# Patient Record
Sex: Male | Born: 1975
Health system: Southern US, Community
[De-identification: ages and names within clinical notes are randomized; demographics above are authoritative.]

## PROBLEM LIST (undated history)

## (undated) DIAGNOSIS — K5190 Ulcerative colitis, unspecified, without complications: Secondary | ICD-10-CM

## (undated) DIAGNOSIS — E785 Hyperlipidemia, unspecified: Secondary | ICD-10-CM

## (undated) DIAGNOSIS — I10 Essential (primary) hypertension: Secondary | ICD-10-CM

## (undated) DIAGNOSIS — Z8042 Family history of malignant neoplasm of prostate: Secondary | ICD-10-CM

## (undated) DIAGNOSIS — Z803 Family history of malignant neoplasm of breast: Secondary | ICD-10-CM

## (undated) DIAGNOSIS — C189 Malignant neoplasm of colon, unspecified: Secondary | ICD-10-CM

## (undated) HISTORY — PX: WISDOM TOOTH EXTRACTION: SHX21

## (undated) HISTORY — DX: Family history of malignant neoplasm of breast: Z80.3

## (undated) HISTORY — PX: COLONOSCOPY: SHX174

## (undated) HISTORY — DX: Hyperlipidemia, unspecified: E78.5

## (undated) HISTORY — DX: Essential (primary) hypertension: I10

## (undated) HISTORY — DX: Family history of malignant neoplasm of prostate: Z80.42

---

## 2018-01-26 DIAGNOSIS — Z23 Encounter for immunization: Secondary | ICD-10-CM | POA: Diagnosis not present

## 2019-06-09 ENCOUNTER — Ambulatory Visit: Payer: Self-pay

## 2019-06-09 ENCOUNTER — Ambulatory Visit: Payer: Self-pay | Attending: Internal Medicine

## 2019-06-09 DIAGNOSIS — Z23 Encounter for immunization: Secondary | ICD-10-CM | POA: Insufficient documentation

## 2019-06-09 NOTE — Progress Notes (Signed)
   Covid-19 Vaccination Clinic  Name:  Don Meyer    MRN: [id number] DOB: 01/15/1976  06/09/2019  [name] was observed post Covid-19 immunization for 15 minutes without incident. He was provided with Vaccine Information Sheet and instruction to access the V-Safe system.   [name] was instructed to call 911 with any severe reactions post vaccine: Don Meyer Kitchen Difficulty breathing  . Swelling of face and throat  . A fast heartbeat  . A bad rash all over body  . Dizziness and weakness   Immunizations Administered    Name Date Dose VIS Date Route   Pfizer COVID-19 Vaccine 06/09/2019 11:45 AM 0.3 mL 03/15/2019 Intramuscular   Manufacturer: [place]   Lot: [id number]   [place]: [id number]

## 2019-07-09 ENCOUNTER — Ambulatory Visit: Payer: Self-pay

## 2019-07-10 ENCOUNTER — Ambulatory Visit: Payer: Self-pay | Attending: Internal Medicine

## 2019-07-10 DIAGNOSIS — Z23 Encounter for immunization: Secondary | ICD-10-CM

## 2019-07-10 NOTE — Progress Notes (Signed)
   Covid-19 Vaccination Clinic  Name:  Don Meyer    MRN: [id number] DOB: 11/17/1975  07/10/2019  [name] was observed post Covid-19 immunization for 15 minutes without incident. He was provided with Vaccine Information Sheet and instruction to access the V-Safe system.   [name] was instructed to call 911 with any severe reactions post vaccine: Marland Kitchen Difficulty breathing  . Swelling of face and throat  . A fast heartbeat  . A bad rash all over body  . Dizziness and weakness   Immunizations Administered    Name Date Dose VIS Date Route   Pfizer COVID-19 Vaccine 07/10/2019  1:04 PM 0.3 mL 03/15/2019 Intramuscular   Manufacturer: [place]   Lot: [id number]   [place]: [id number]

## 2019-12-19 DIAGNOSIS — Z23 Encounter for immunization: Secondary | ICD-10-CM | POA: Diagnosis not present

## 2019-12-19 DIAGNOSIS — K51919 Ulcerative colitis, unspecified with unspecified complications: Secondary | ICD-10-CM | POA: Diagnosis not present

## 2019-12-19 DIAGNOSIS — R9431 Abnormal electrocardiogram [ECG] [EKG]: Secondary | ICD-10-CM | POA: Diagnosis not present

## 2019-12-19 DIAGNOSIS — R030 Elevated blood-pressure reading, without diagnosis of hypertension: Secondary | ICD-10-CM | POA: Diagnosis not present

## 2019-12-19 DIAGNOSIS — Z0000 Encounter for general adult medical examination without abnormal findings: Secondary | ICD-10-CM | POA: Diagnosis not present

## 2019-12-23 DIAGNOSIS — E7800 Pure hypercholesterolemia, unspecified: Secondary | ICD-10-CM | POA: Diagnosis not present

## 2019-12-23 DIAGNOSIS — Z0000 Encounter for general adult medical examination without abnormal findings: Secondary | ICD-10-CM | POA: Diagnosis not present

## 2020-01-01 DIAGNOSIS — K5190 Ulcerative colitis, unspecified, without complications: Secondary | ICD-10-CM | POA: Diagnosis not present

## 2020-01-01 DIAGNOSIS — K625 Hemorrhage of anus and rectum: Secondary | ICD-10-CM | POA: Diagnosis not present

## 2020-01-03 NOTE — Progress Notes (Signed)
Patient referred by Deland Pretty, MD for abnormal EKG  Subjective:   Don Meyer, male    DOB: [birth year], 44 y.o.   MRN: [id number]   Chief Complaint  Patient presents with  . Abnormal ECG  . New Patient (Initial Visit)  . Hypertension     HPI  44 y.o. 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 He has had untreated hypertension and hyperlipidemia for quite some time. He has now decided to take care of his health issues.  He has establish care with a PCP and was recently started on losartan 25 mg and 10 mg daily.  Is started walking treadmill for 2 miles 4 days a week.  He admits to eating fast food in the past, but is trying to make positive changes to his diet as well.  He denies any chest pain shortness of breath, orthopnea, PND symptoms.  Past Medical History:  Diagnosis Date  . Hyperlipidemia   . Hypertension      History reviewed. No pertinent surgical history.   Social History   Tobacco Use  Smoking Status Never Smoker  Smokeless Tobacco Never Used    Social History   Substance and Sexual Activity  Alcohol Use Yes   Comment: occ     Family History  Problem Relation Age of Onset  . Heart disease Mother   . Drug abuse Brother      Current Outpatient Medications on File Prior to Visit  Medication Sig Dispense Refill  . losartan (COZAAR) 25 MG tablet Take 25 mg by mouth daily.    . Misc Natural Products (CHOLESTEROL SUPPORT PO) Take 1 tablet by mouth daily.    . Multiple Vitamin (ONE-A-DAY MENS PO) Take 1 tablet by mouth daily.    . Omega-3 Fatty Acids (FISH OIL) 600 MG CAPS Take 1 tablet by mouth daily.    . rosuvastatin (CRESTOR) 10 MG tablet Take 10 mg by mouth daily.     No current facility-administered medications on file prior to visit.    Cardiovascular and other pertinent studies:  EKG 01/06/2020: Sinus rhythm 93 bpm Nonspecific T  wave chages  EKG 12/19/2019: Sinus rhythm 66 bpm. Inferolateral T wave inversion, consider ischemia   Recent labs: 12/23/2019: Glucose 84, BUN/Cr 11/?. EGFR 97. Na/K 145/4.8. ALT: 84 H/H 15.7/46.6. MCV 88.9. Platelets 249 Chol 244, TG 183, HDL 26, LDL 181   Review of Systems  Cardiovascular: Negative for chest pain, dyspnea on exertion, leg swelling, palpitations and syncope.         Vitals:   01/06/20 1514 01/06/20 1516  BP: (!) 155/109 (!) 153/104  Pulse: (!) 102 (!) 102  Resp: 16   SpO2: 100%      Body mass index is 34.97 kg/m. Filed Weights   01/06/20 1514  Weight: 230 lb (104.3 kg)     Objective:   Physical Exam Vitals and nursing note reviewed.  Constitutional:      General: He is not in acute distress. Neck:     Vascular: No JVD.  Cardiovascular:     Rate and Rhythm: Normal rate and regular rhythm.     Heart sounds: Normal heart sounds. No murmur heard.   Pulmonary:     Effort: Pulmonary effort is normal.     Breath sounds: Normal breath sounds. No wheezing or rales.         Assessment & Recommendations:   44  y.o. Caucasian male with hypertension, hyperlipidemia, abnormal EKG  Hypertension: Increase losartan to 50 mg daily.  Discussed low-salt diet. Check BMP in 1 week.  Abnormal EKG: Likely hypertensive changes.  Will obtain echocardiogram.  Hyperlipidemia: Increase rosuvastatin 20 mg daily.  Discussed heart healthy diet, including Mediterranean diet. We discussed performing calcium score scan.  Patient will follow up at this time. Repeat lipid panel in 3 months.  Follow-up in 3 months.   Thank you for referring the patient to [place]. Please feel free to contact with any questions.   [name], MD Pager: [phone] Office: [phone]

## 2020-01-06 ENCOUNTER — Ambulatory Visit: Payer: 59 | Admitting: Cardiology

## 2020-01-06 ENCOUNTER — Encounter: Payer: Self-pay | Admitting: Cardiology

## 2020-01-06 ENCOUNTER — Other Ambulatory Visit: Payer: Self-pay

## 2020-01-06 VITALS — BP 153/104 | HR 102 | Resp 16 | Ht 68.0 in | Wt 230.0 lb

## 2020-01-06 DIAGNOSIS — E782 Mixed hyperlipidemia: Secondary | ICD-10-CM

## 2020-01-06 DIAGNOSIS — R9431 Abnormal electrocardiogram [ECG] [EKG]: Secondary | ICD-10-CM

## 2020-01-06 DIAGNOSIS — I10 Essential (primary) hypertension: Secondary | ICD-10-CM

## 2020-01-06 MED ORDER — ROSUVASTATIN CALCIUM 20 MG PO TABS: 20.0000 mg | ORAL_TABLET | Freq: Every day | ORAL | 3 refills | Status: DC

## 2020-01-06 MED ORDER — LOSARTAN POTASSIUM 50 MG PO TABS: 50.0000 mg | ORAL_TABLET | Freq: Every day | ORAL | 3 refills | Status: DC

## 2020-04-06 DIAGNOSIS — I10 Essential (primary) hypertension: Secondary | ICD-10-CM | POA: Diagnosis not present

## 2020-04-06 DIAGNOSIS — E782 Mixed hyperlipidemia: Secondary | ICD-10-CM | POA: Diagnosis not present

## 2020-04-07 LAB — BASIC METABOLIC PANEL
BUN/Creatinine Ratio: 12 (ref 9–20)
BUN: 11 mg/dL (ref 6–24)
CO2: 25 mmol/L (ref 20–29)
Calcium: 9.1 mg/dL (ref 8.7–10.2)
Chloride: 105 mmol/L (ref 96–106)
Creatinine, Ser: 0.91 mg/dL (ref 0.76–1.27)
GFR calc Af Amer: 119 mL/min/{1.73_m2} (ref >59)
GFR calc non Af Amer: 103 mL/min/{1.73_m2} (ref >59)
Glucose: 90 mg/dL (ref 65–99)
Potassium: 3.8 mmol/L (ref 3.5–5.2)
Sodium: 144 mmol/L (ref 134–144)

## 2020-04-07 LAB — LIPID PANEL
Chol/HDL Ratio: 6 ratio — ABNORMAL HIGH (ref 0.0–5.0)
Cholesterol, Total: 137 mg/dL (ref 100–199)
HDL: 23 mg/dL — ABNORMAL LOW (ref >39)
LDL Chol Calc (NIH): 73 mg/dL (ref 0–99)
Triglycerides: 249 mg/dL — ABNORMAL HIGH (ref 0–149)
VLDL Cholesterol Cal: 41 mg/dL — ABNORMAL HIGH (ref 5–40)

## 2020-04-08 ENCOUNTER — Other Ambulatory Visit: Payer: Self-pay

## 2020-04-08 ENCOUNTER — Ambulatory Visit: Payer: 59 | Admitting: Cardiology

## 2020-04-08 ENCOUNTER — Encounter: Payer: Self-pay | Admitting: Cardiology

## 2020-04-08 VITALS — BP 148/92 | HR 85 | Resp 16 | Ht 68.0 in | Wt 236.0 lb

## 2020-04-08 DIAGNOSIS — E782 Mixed hyperlipidemia: Secondary | ICD-10-CM | POA: Diagnosis not present

## 2020-04-08 DIAGNOSIS — I10 Essential (primary) hypertension: Secondary | ICD-10-CM | POA: Diagnosis not present

## 2020-04-08 MED ORDER — LOSARTAN POTASSIUM-HCTZ 50-12.5 MG PO TABS: 1.0000 | ORAL_TABLET | Freq: Every day | ORAL | 3 refills | Status: DC

## 2020-04-08 NOTE — Progress Notes (Signed)
Patient referred by Deland Pretty, MD for abnormal EKG  Subjective:   Don Meyer, male    DOB: 1976-02-17, 45 y.o.   MRN: [id number]   Chief Complaint  Patient presents with  . Hypertension  . Follow-up     HPI  45 y.o. Caucasian male with hypertension, hyperlipidemia, abnormal EKG  Reviewed recent lab results with the patient, details below.  He is trying to make changes to his diet and lifestyle.  Blood pressure has slightly improved, but remains elevated.  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 He has had untreated hypertension and hyperlipidemia for quite some time. He has now decided to take care of his health issues.  He has establish care with a PCP and was recently started on losartan 25 mg and 10 mg daily.  Is started walking treadmill for 2 miles 4 days a week.  He admits to eating fast food in the past, but is trying to make positive changes to his diet as well.  He denies any chest pain shortness of breath, orthopnea, PND symptoms.    Current Outpatient Medications on File Prior to Visit  Medication Sig Dispense Refill  . losartan (COZAAR) 50 MG tablet Take 1 tablet (50 mg total) by mouth daily. 90 tablet 3  . Misc Natural Products (CHOLESTEROL SUPPORT PO) Take 1 tablet by mouth daily.    . Multiple Vitamin (ONE-A-DAY MENS PO) Take 1 tablet by mouth daily.    . Omega-3 Fatty Acids (FISH OIL) 600 MG CAPS Take 1 tablet by mouth daily.    . rosuvastatin (CRESTOR) 20 MG tablet Take 1 tablet (20 mg total) by mouth daily. 90 tablet 3   No current facility-administered medications on file prior to visit.    Cardiovascular and other pertinent studies:  EKG 01/06/2020: Sinus rhythm 93 bpm Nonspecific T wave chages  EKG 12/19/2019: Sinus rhythm 66 bpm. Inferolateral T wave inversion, consider ischemia   Recent labs: 04/06/2020: Glucose 90, BUN/Cr 11/0.91. EGFR normal. Na/K 144/3.8. Chol 137,  TG 249, HDL 23, LDL 73  12/23/2019: Glucose 84, BUN/Cr 11/?. EGFR 97. Na/K 145/4.8. ALT: 84 H/H 15.7/46.6. MCV 88.9. Platelets 249 Chol 244, TG 183, HDL 26, LDL 181   Review of Systems  Cardiovascular: Negative for chest pain, dyspnea on exertion, leg swelling, palpitations and syncope.         Vitals:   04/08/20 1444 04/08/20 1446  BP: (!) 146/95 (!) 148/92  Pulse: 88 85  Resp: 16   SpO2: 98% 97%     Body mass index is 35.88 kg/m. Filed Weights   04/08/20 1444  Weight: 236 lb (107 kg)     Objective:   Physical Exam Vitals and nursing note reviewed.  Constitutional:      General: He is not in acute distress. Neck:     Vascular: No JVD.  Cardiovascular:     Rate and Rhythm: Normal rate and regular rhythm.     Heart sounds: Normal heart sounds. No murmur heard.   Pulmonary:     Effort: Pulmonary effort is normal.     Breath sounds: Normal breath sounds. No wheezing or rales.         Assessment & Recommendations:   45 y.o. Caucasian male with hypertension, hyperlipidemia, abnormal EKG  Hypertension: Change losartan 50 mg to losartan-hydrochlorothiazide 50-12.5 mg daily.   Patient will send a message next 2 weeks.  If blood pressure remains elevated, will  increase to 100-25 mg daily.    Hyperlipidemia: LDL 181-->73 on rosuvastatin 20 mg daily. Continue the same, Recommend heart healthy diet, including Mediterranean diet.  Follow-up in 3 months   [name], MD Pager: [phone] Office: [phone]

## 2020-05-21 ENCOUNTER — Other Ambulatory Visit: Payer: Self-pay | Admitting: Surgery

## 2020-05-21 DIAGNOSIS — K5100 Ulcerative (chronic) pancolitis without complications: Secondary | ICD-10-CM | POA: Diagnosis not present

## 2020-05-21 DIAGNOSIS — Z1211 Encounter for screening for malignant neoplasm of colon: Secondary | ICD-10-CM | POA: Diagnosis not present

## 2020-05-21 DIAGNOSIS — K635 Polyp of colon: Secondary | ICD-10-CM | POA: Diagnosis not present

## 2020-05-21 DIAGNOSIS — K529 Noninfective gastroenteritis and colitis, unspecified: Secondary | ICD-10-CM | POA: Diagnosis not present

## 2020-05-21 DIAGNOSIS — C187 Malignant neoplasm of sigmoid colon: Secondary | ICD-10-CM | POA: Diagnosis not present

## 2020-05-21 DIAGNOSIS — K5190 Ulcerative colitis, unspecified, without complications: Secondary | ICD-10-CM | POA: Diagnosis not present

## 2020-05-28 ENCOUNTER — Other Ambulatory Visit: Payer: Self-pay | Admitting: Gastroenterology

## 2020-05-28 ENCOUNTER — Other Ambulatory Visit (HOSPITAL_COMMUNITY): Payer: Self-pay | Admitting: Gastroenterology

## 2020-05-28 DIAGNOSIS — C189 Malignant neoplasm of colon, unspecified: Secondary | ICD-10-CM

## 2020-05-29 ENCOUNTER — Ambulatory Visit (HOSPITAL_BASED_OUTPATIENT_CLINIC_OR_DEPARTMENT_OTHER)
Admission: RE | Admit: 2020-05-29 | Discharge: 2020-05-29 | Disposition: A | Discharge status: Home / Self Care | Payer: 59 | Source: Ambulatory Visit | Attending: Gastroenterology | Admitting: Gastroenterology

## 2020-05-29 ENCOUNTER — Other Ambulatory Visit (HOSPITAL_COMMUNITY): Payer: Self-pay | Admitting: Gastroenterology

## 2020-05-29 ENCOUNTER — Other Ambulatory Visit: Payer: Self-pay

## 2020-05-29 DIAGNOSIS — C189 Malignant neoplasm of colon, unspecified: Secondary | ICD-10-CM | POA: Diagnosis not present

## 2020-05-29 DIAGNOSIS — N281 Cyst of kidney, acquired: Secondary | ICD-10-CM | POA: Diagnosis not present

## 2020-05-29 DIAGNOSIS — K7689 Other specified diseases of liver: Secondary | ICD-10-CM | POA: Diagnosis not present

## 2020-05-29 DIAGNOSIS — Z7689 Persons encountering health services in other specified circumstances: Secondary | ICD-10-CM | POA: Diagnosis not present

## 2020-05-29 DIAGNOSIS — K760 Fatty (change of) liver, not elsewhere classified: Secondary | ICD-10-CM | POA: Diagnosis not present

## 2020-05-29 DIAGNOSIS — R9389 Abnormal findings on diagnostic imaging of other specified body structures: Secondary | ICD-10-CM

## 2020-05-29 DIAGNOSIS — K769 Liver disease, unspecified: Secondary | ICD-10-CM

## 2020-05-29 MED ORDER — IOHEXOL 300 MG/ML  SOLN
100.0000 mL | Freq: Once | INTRAMUSCULAR | Status: AC | PRN
  Administered 2020-05-29: 100 mL via INTRAVENOUS

## 2020-06-02 DIAGNOSIS — K5190 Ulcerative colitis, unspecified, without complications: Secondary | ICD-10-CM | POA: Diagnosis not present

## 2020-06-02 DIAGNOSIS — C187 Malignant neoplasm of sigmoid colon: Secondary | ICD-10-CM | POA: Diagnosis not present

## 2020-06-08 ENCOUNTER — Other Ambulatory Visit: Payer: Self-pay

## 2020-06-08 ENCOUNTER — Ambulatory Visit (HOSPITAL_COMMUNITY)
Admission: RE | Admit: 2020-06-08 | Discharge: 2020-06-08 | Disposition: A | Discharge status: Home / Self Care | Payer: 59 | Source: Ambulatory Visit | Attending: Gastroenterology | Admitting: Gastroenterology

## 2020-06-08 DIAGNOSIS — K760 Fatty (change of) liver, not elsewhere classified: Secondary | ICD-10-CM | POA: Diagnosis not present

## 2020-06-08 DIAGNOSIS — R9389 Abnormal findings on diagnostic imaging of other specified body structures: Secondary | ICD-10-CM | POA: Insufficient documentation

## 2020-06-08 DIAGNOSIS — N281 Cyst of kidney, acquired: Secondary | ICD-10-CM | POA: Diagnosis not present

## 2020-06-08 DIAGNOSIS — K769 Liver disease, unspecified: Secondary | ICD-10-CM | POA: Diagnosis not present

## 2020-06-08 DIAGNOSIS — C189 Malignant neoplasm of colon, unspecified: Secondary | ICD-10-CM | POA: Diagnosis not present

## 2020-06-08 MED ORDER — GADOBUTROL 1 MMOL/ML IV SOLN
10.0000 mL | Freq: Once | INTRAVENOUS | Status: AC | PRN
  Administered 2020-06-08: 10 mL via INTRAVENOUS

## 2020-06-09 DIAGNOSIS — C187 Malignant neoplasm of sigmoid colon: Secondary | ICD-10-CM | POA: Diagnosis not present

## 2020-06-25 DIAGNOSIS — Z01818 Encounter for other preprocedural examination: Secondary | ICD-10-CM | POA: Diagnosis not present

## 2020-06-25 DIAGNOSIS — C187 Malignant neoplasm of sigmoid colon: Secondary | ICD-10-CM | POA: Diagnosis not present

## 2020-06-29 LAB — SURGICAL PATHOLOGY

## 2020-07-07 ENCOUNTER — Ambulatory Visit: Payer: Self-pay | Admitting: Surgery

## 2020-07-07 NOTE — H&P (Signed)
CC: Referred by Dr. Benson Meyer for sigmoidal polyp biopsied, adenocarcinoma in setting of long-standing ulcerative colitis  HPI: [name] is a very pleasant 40yoM who reports a 20+ year history of ulcer colitis. He states his first diagnosed back in his early 57s. Approximately 15 years ago he had been maintained on what he believes to be in his alanine and occasional steroids but for the last 15 years has been on no treatment. He reports he had a colonoscopy in 2007 which I do not have a copy of. He underwent a colonoscopy with Dr. Benson Meyer 05/21/20 that demonstrated moderately active pan colitis consistent with ulcerative colitis with random biopsies. A polyp was found in the sigmoid colon that appeared worrisome in its appearance and therefore was just biopsied. This returned with adenocarcinoma. The other biopsies throughout his colon were also in the same bottle but returned with chronic colitis, mildly active. No dysplasia or malignancy. He underwent staging CT 05/29/20 chest/abdomen/pelvis which showed 2 tiny indeterminate low attenuating lesions in the liver. Liver metastases cannot definitely be excluded and MRI recommended. No other worrisome findings. He is scheduled for an MRI 06/08/20.  Of note, on his colonoscopy, he does have rather diffuse pan colitis which was characterized as mild in appearance but notable throughout his entire colon. He has no history of anorectal abscesses or fistula. He has no history of recurring aphthous ulcers or anal fissures.  He reports he has 1-2 BMs per day at baseline, well formed stool without blood. Denies abdominal pain or bloating.  INTERVAL HX He returns today for follow-up - he denies any changes in his health or health history. MRI abd w/without contrast was completed 06/08/20 - showed the liver lesions noted on his CT scan to be benign subcentimeter cysts without solid mass or suspicious enhancement. No evidence of metastatic disease or lymphadenopathy.  Some hepatic steatosis. He has had some time to think about everything and go over his options with his wife. He is interested in pursuing ileal J-pouch if possible. Path has been requested from [facility] for review with our GI pathologists  PMH: Ulcerative colitis  PSH: Denies  FHx: Denies FHx of colorectal, breast, endometrial, ovarian or cervical cancer. He also denies any known family history of IBD/Crohn's/ulcerative colitis.  Social: Denies use of tobacco/EtOH/drugs. He is here today with his wife. He works for the city of [place]: A comprehensive 10 system review of systems was completed with the patient and pertinent findings as noted above.  The patient is a 45 year old male.   Allergies Don Meyer, CMA; 06/09/2020 9:05 AM) Allergies Reconciled   Medication History Don Meyer Don Meyer, CMA; 06/09/2020 9:05 AM) Losartan Potassium (25MG  Tablet, Oral) Active. Crestor (10MG  Tablet, Oral) Active. Multi-Vitamin (Oral) Active. Medications Reconciled    Review of Systems Don Gave M. Alexes Lamarque MD; 06/09/2020 9:23 AM) General Not Present- Appetite Loss, Chills, Fatigue, Fever, Night Sweats, Weight Gain and Weight Loss. HEENT Not Present- Blurred Vision, Double Vision and Headache. Respiratory Not Present- Cough and Decreased Exercise Tolerance. Gastrointestinal Not Present- Abdominal Pain, Bloating, Chronic diarrhea and Constipation. Male Genitourinary Not Present- Blood in Urine, Change in Urinary Stream, Frequency, Impotence, Nocturia, Painful Urination, Urgency and Urine Leakage. Musculoskeletal Not Present- Back Pain, Joint Pain, Joint Stiffness, Muscle Pain, Muscle Weakness and Swelling of Extremities. Neurological Not Present- Decreased Memory, Fainting, Headaches, Numbness, Seizures, Tingling, Tremor, Trouble walking and Weakness. Psychiatric Not Present- Anxiety, Bipolar, Change in Sleep Pattern, Depression, Fearful and Frequent crying. Endocrine Not Present-  Cold  Intolerance, Excessive Hunger, Hair Changes, Heat Intolerance and New Diabetes. Hematology Not Present- Abnormal Bleeding and Blood Clots.   Physical Exam Don Gave M. Levander Katzenstein MD; 06/09/2020 9:27 AM) The physical exam findings are as follows: Note: Constitutional: No acute distress; conversant; wearing mask Eyes: Moist conjunctiva; no lid lag; anicteric sclerae; pupils equal and round Neck: Trachea midline Lungs: Normal respiratory effort CV: rrr; no pitting edema GI: Abdomen soft, nontender, nondistended; no palpable hepatosplenomegaly MSK: Normal gait Psychiatric: Appropriate affect; alert and oriented 3    Assessment & Plan Don Gave M. Taelon Bendorf MD; 06/09/2020 9:30 AM) CANCER OF SIGMOID COLON (C18.7) Story: Don Meyer is a very pleasant 16yoM with presumably long-standing ulcerative colitiis - now with sigmoid adenocarcinoma. CT CAP shows 2 tiny hypodensities in liver - MRI confirms these to be benign cysts  -We have provided reading materials and additional resources today including [place] as well as [email or web address]. We discussed quality of life related issues with both ileostomy and J pouch. We discussed general expectations as well. We also discussed scenarios where patients may ultimately be determined to have more phenotypes of Crohn's disease, pouchitis, pouch failure. Impression: -Requesting path from [facility] for 2nd pathologist to review regarding his IBD diagnosis -We discussed the anatomy and physiology of the GI tract as well as pathophysiology of IBD and colon cancer. We discussed that he does have ulcerative colitis in a now diagnosed adenocarcinoma of the colon, given his young age, recommendations for a proctocolectomy. We discussed options following this for reconstruction including a permanent end ileostomy versus the potential ileal J-pouch with diverting loop ileostomy.  -He is interested in pursuing restorative proctocolectomy with ileal j pouch,  diverting loop ileostomy -The planned procedures, material risks (including, but not limited to, pain, bleeding, infection, scarring, need for blood transfusion, damage to surrounding structures- blood vessels/nerves/viscus/organs, damage to ureter, urine leak, leak from anastomosis, need for additional procedures, erectile dysfunction and/or retrograde ejaculation, worsening of pre-existing medical conditions, need for stoma which may be permanent, pouch failure, inability for pouch to reach rending permeant ileostomy, hernia, recurrence, DVT/PE, pneumonia, heart attack, stroke, death) benefits and alternatives to surgery were discussed at length. The patient's and his wife's questions were answered to their satisfaction, they voiced understanding and elected to proceed with surgery. Additionally, we discussed typical postoperative expectations and the recovery process.

## 2020-07-07 NOTE — Progress Notes (Addendum)
COVID Vaccine Completed: x3 Date COVID Vaccine completed: 06-09-19 & 07-10-19 Has received booster:  02-2020 Moderna COVID vaccine manufacturer: Pfizer     Date of COVID positive in last 90 days:  N/A  PCP - Deland Pretty, MD Cardiologist - Vernell Leep, MD   Chest x-ray - CT chest 05-29-20 Epic EKG - 01-06-20 Epic Stress Test - N/A ECHO - N/A Cardiac Cath - N/A Pacemaker/ICD device last checked: Spinal Cord Stimulator:  Sleep Study - N/A CPAP -   Fasting Blood Sugar - N/A Checks Blood Sugar _____ times a day  Blood Thinner Instructions:  N/A Aspirin Instructions: Last Dose:  Activity level:  Can go up a flight of stairs and perform activities of daily living without stopping and without symptoms of chest pain or shortness of breath.  Able to exercise without symptoms   Anesthesia review: Eval by cardiology for abnormal EKG, HTN and hyperlipdemia  Stop Bang 5  Patient denies shortness of breath, fever, cough and chest pain at PAT appointment   Patient verbalized understanding of instructions that were given to them at the PAT appointment. Patient was also instructed that they will need to review over the PAT instructions again at home before surgery.

## 2020-07-07 NOTE — Patient Instructions (Addendum)
DUE TO COVID-19 ONLY ONE VISITOR IS ALLOWED TO COME WITH YOU AND STAY IN THE WAITING ROOM ONLY DURING PRE OP AND PROCEDURE.   **NO VISITORS ARE ALLOWED IN THE SHORT STAY AREA OR RECOVERY ROOM!!**  IF YOU WILL BE ADMITTED INTO THE HOSPITAL YOU ARE ALLOWED ONLY TWO SUPPORT PEOPLE DURING VISITATION HOURS ONLY (10AM -8PM)   . The support person(s) may change daily. . The support person(s) must pass our screening, gel in and out, and wear a mask at all times, including in the patient's room. . Patients must also wear a mask when staff or their support person are in the room.  No visitors under the age of 11. Any visitor under the age of 46 must be accompanied by an adult.    COVID SWAB TESTING MUST BE COMPLETED ON:  Tuesday, 07-21-20 @ 8:10 AM   [address] [place], [place] [address]  (Must self quarantine after testing. Follow instructions on handout.)        Your procedure is scheduled on:  Friday, 07-24-20    Report to [facility] Main  Entrance   Report to Short Stay at 5:15 AM   [facility])    Call this number if you have problems the morning of surgery [phone]   Do not eat food :After Midnight.   May have liquids until 4:15 AM day of surgery  CLEAR LIQUID DIET  Foods Allowed                                                                     Foods Excluded  Water, Black Coffee and tea, regular and decaf             liquids that you cannot  Plain Jell-O in any flavor  (No red)                                    see through such as: Fruit ices (not with fruit pulp)                                      milk, soups, orange juice              Iced Popsicles (No red)                                      All solid food                                   Apple juices Sports drinks like Gatorade (No red) Lightly seasoned clear broth or consume(fat free) Sugar, honey syrup  Sample Menu Breakfast                                Lunch  Supper Cranberry juice                    Beef broth                            Chicken broth Jell-O                                     Grape juice                           Apple juice Coffee or tea                        Jell-O                                      Popsicle                                                Coffee or tea                        Coffee or tea      Drink 2 Ensure drinks the night before surgery by 10:00 PM.  Complete one Ensure drink the morning of surgery 3 hours prior to  scheduled surgery at 4:15 AM.     1. The day of surgery:  ? Drink ONE (1) Pre-Surgery Clear Ensure or G2 by am the morning of surgery. Drink in one sitting. Do not sip.  ? This drink was given to you during your hospital  pre-op appointment visit. ? Nothing else to drink after completing the  Pre-Surgery Clear Ensure or G2.          If you have questions, please contact your surgeon's office.     Oral Hygiene is also important to reduce your risk of infection.                                    Remember - BRUSH YOUR TEETH THE MORNING OF SURGERY WITH YOUR REGULAR TOOTHPASTE   Do NOT smoke after Midnight   Take these medicines the morning of surgery with A SIP OF WATER:  Lialda, Rosuvastatin                              You may not have any metal on your body including  jewelry, and body piercings             Do not wear lotions, powder, cologne, or deodorant             Men may shave face and neck.   Do not bring valuables to the hospital. [place].   Contacts, dentures or bridgework may not be worn into surgery.   Bring small overnight bag day of surgery.    IF YOU  HAVE QUESTIONS ABOUT YOUR PRE OP INSTRUCTIONS PLEASE CALL [facility] - Preparing for Surgery Before surgery, you can play an important role.  Because skin is not sterile, your skin needs to be as free of germs as possible.  You can reduce the  number of germs on your skin by washing with CHG (chlorahexidine gluconate) soap before surgery.  CHG is an antiseptic cleaner which kills germs and bonds with the skin to continue killing germs even after washing. Please DO NOT use if you have an allergy to CHG or antibacterial soaps.  If your skin becomes reddened/irritated stop using the CHG and inform your nurse when you arrive at Short Stay. Do not shave (including legs and underarms) for at least 48 hours prior to the first CHG shower.  You may shave your face/neck.  Please follow these instructions carefully:  1.  Shower with CHG Soap the night before surgery and the  morning of surgery.  2.  If you choose to wash your hair, wash your hair first as usual with your normal  shampoo.  3.  After you shampoo, rinse your hair and body thoroughly to remove the shampoo.                             4.  Use CHG as you would any other liquid soap.  You can apply chg directly to the skin and wash.  Gently with a scrungie or clean washcloth.  5.  Apply the CHG Soap to your body ONLY FROM THE NECK DOWN.   Do   not use on face/ open                           Wound or open sores. Avoid contact with eyes, ears mouth and   genitals (private parts).                       Wash face,  Genitals (private parts) with your normal soap.             6.  Wash thoroughly, paying special attention to the area where your    surgery  will be performed.  7.  Thoroughly rinse your body with warm water from the neck down.  8.  DO NOT shower/wash with your normal soap after using and rinsing off the CHG Soap.                9.  Pat yourself dry with a clean towel.            10.  Wear clean pajamas.            11.  Place clean sheets on your bed the night of your first shower and do not  sleep with pets. Day of Surgery : Do not apply any lotions/deodorants the morning of surgery.  Please wear clean clothes to the hospital/surgery center.  FAILURE TO FOLLOW THESE  INSTRUCTIONS MAY RESULT IN THE CANCELLATION OF YOUR SURGERY  PATIENT SIGNATURE_________________________________  NURSE SIGNATURE__________________________________  ________________________________________________________________________   Don Meyer  An incentive spirometer is a tool that can help keep your lungs clear and active. This tool measures how well you are filling your lungs with each breath. Taking long deep breaths may help reverse or decrease the chance of developing breathing (pulmonary) problems (especially infection) following:  A long period of time  when you are unable to move or be active. BEFORE THE PROCEDURE   If the spirometer includes an indicator to show your best effort, your nurse or respiratory therapist will set it to a desired goal.  If possible, sit up straight or lean slightly forward. Try not to slouch.  Hold the incentive spirometer in an upright position. INSTRUCTIONS FOR USE  1. Sit on the edge of your bed if possible, or sit up as far as you can in bed or on a chair. 2. Hold the incentive spirometer in an upright position. 3. Breathe out normally. 4. Place the mouthpiece in your mouth and seal your lips tightly around it. 5. Breathe in slowly and as deeply as possible, raising the piston or the ball toward the top of the column. 6. Hold your breath for 3-5 seconds or for as long as possible. Allow the piston or ball to fall to the bottom of the column. 7. Remove the mouthpiece from your mouth and breathe out normally. 8. Rest for a few seconds and repeat Steps 1 through 7 at least 10 times every 1-2 hours when you are awake. Take your time and take a few normal breaths between deep breaths. 9. The spirometer may include an indicator to show your best effort. Use the indicator as a goal to work toward during each repetition. 10. After each set of 10 deep breaths, practice coughing to be sure your lungs are clear. If you have an incision (the  cut made at the time of surgery), support your incision when coughing by placing a pillow or rolled up towels firmly against it. Once you are able to get out of bed, walk around indoors and cough well. You may stop using the incentive spirometer when instructed by your caregiver.  RISKS AND COMPLICATIONS  Take your time so you do not get dizzy or light-headed.  If you are in pain, you may need to take or ask for pain medication before doing incentive spirometry. It is harder to take a deep breath if you are having pain. AFTER USE  Rest and breathe slowly and easily.  It can be helpful to keep track of a log of your progress. Your caregiver can provide you with a simple table to help with this. If you are using the spirometer at home, follow these instructions: [place] IF:   You are having difficultly using the spirometer.  You have trouble using the spirometer as often as instructed.  Your pain medication is not giving enough relief while using the spirometer.  You develop fever of 100.5 F (38.1 C) or higher. SEEK IMMEDIATE MEDICAL CARE IF:   You cough up bloody sputum that had not been present before.  You develop fever of 102 F (38.9 C) or greater.  You develop worsening pain at or near the incision site. MAKE SURE YOU:   Understand these instructions.  Will watch your condition.  Will get help right away if you are not doing well or get worse. Document Released: 08/01/2006 Document Revised: 06/13/2011 Document Reviewed: 10/02/2006 Exit[employer] Patient Information 2014 [employer], [place].   ________________________________________________________________________  WHAT IS A BLOOD TRANSFUSION? Blood Transfusion Information  A transfusion is the replacement of blood or some of its parts. Blood is made up of multiple cells which provide different functions.  Red blood cells carry oxygen and are used for blood loss replacement.  White blood cells fight against  infection.  Platelets control bleeding.  Plasma helps clot blood.  Other blood products are available for specialized needs, such as hemophilia or other clotting disorders. BEFORE THE TRANSFUSION  Who gives blood for transfusions?   Healthy volunteers who are fully evaluated to make sure their blood is safe. This is blood bank blood. Transfusion therapy is the safest it has ever been in the practice of medicine. Before blood is taken from a donor, a complete history is taken to make sure that person has no history of diseases nor engages in risky social behavior (examples are intravenous drug use or sexual activity with multiple partners). The donor's travel history is screened to minimize risk of transmitting infections, such as malaria. The donated blood is tested for signs of infectious diseases, such as HIV and hepatitis. The blood is then tested to be sure it is compatible with you in order to minimize the chance of a transfusion reaction. If you or a relative donates blood, this is often done in anticipation of surgery and is not appropriate for emergency situations. It takes many days to process the donated blood. RISKS AND COMPLICATIONS Although transfusion therapy is very safe and saves many lives, the main dangers of transfusion include:   Getting an infectious disease.  Developing a transfusion reaction. This is an allergic reaction to something in the blood you were given. Every precaution is taken to prevent this. The decision to have a blood transfusion has been considered carefully by your caregiver before blood is given. Blood is not given unless the benefits outweigh the risks. AFTER THE TRANSFUSION  Right after receiving a blood transfusion, you will usually feel much better and more energetic. This is especially true if your red blood cells have gotten low (anemic). The transfusion raises the level of the red blood cells which carry oxygen, and this usually causes an energy  increase.  The nurse administering the transfusion will monitor you carefully for complications. HOME CARE INSTRUCTIONS  No special instructions are needed after a transfusion. You may find your energy is better. Speak with your caregiver about any limitations on activity for underlying diseases you may have. SEEK MEDICAL CARE IF:   Your condition is not improving after your transfusion.  You develop redness or irritation at the intravenous (IV) site. SEEK IMMEDIATE MEDICAL CARE IF:  Any of the following symptoms occur over the next 12 hours:  Shaking chills.  You have a temperature by mouth above 102 F (38.9 C), not controlled by medicine.  Chest, back, or muscle pain.  People around you feel you are not acting correctly or are confused.  Shortness of breath or difficulty breathing.  Dizziness and fainting.  You get a rash or develop hives.  You have a decrease in urine output.  Your urine turns a dark color or changes to pink, red, or brown. Any of the following symptoms occur over the next 10 days:  You have a temperature by mouth above 102 F (38.9 C), not controlled by medicine.  Shortness of breath.  Weakness after normal activity.  The white part of the eye turns yellow (jaundice).  You have a decrease in the amount of urine or are urinating less often.  Your urine turns a dark color or changes to pink, red, or brown. Document Released: 03/18/2000 Document Revised: 06/13/2011 Document Reviewed: 11/05/2007 [facility] Patient Information 2014 [place], [place].  _______________________________________________________________________

## 2020-07-13 ENCOUNTER — Other Ambulatory Visit: Payer: Self-pay

## 2020-07-13 ENCOUNTER — Encounter (HOSPITAL_COMMUNITY): Payer: Self-pay

## 2020-07-13 ENCOUNTER — Ambulatory Visit: Payer: 59 | Admitting: Cardiology

## 2020-07-13 ENCOUNTER — Encounter (HOSPITAL_COMMUNITY)
Admission: RE | Admit: 2020-07-13 | Discharge: 2020-07-13 | Disposition: A | Discharge status: Home / Self Care | Payer: 59 | Source: Ambulatory Visit | Attending: Surgery | Admitting: Surgery

## 2020-07-13 DIAGNOSIS — Z01812 Encounter for preprocedural laboratory examination: Secondary | ICD-10-CM | POA: Diagnosis not present

## 2020-07-13 HISTORY — DX: Ulcerative colitis, unspecified, without complications: K51.90

## 2020-07-13 HISTORY — DX: Malignant neoplasm of colon, unspecified: C18.9

## 2020-07-13 LAB — CBC WITH DIFFERENTIAL/PLATELET
Abs Immature Granulocytes: 0.02 10*3/uL (ref 0.00–0.07)
Basophils Absolute: 0 10*3/uL (ref 0.0–0.1)
Basophils Relative: 1 %
Eosinophils Absolute: 0.2 10*3/uL (ref 0.0–0.5)
Eosinophils Relative: 2 %
HCT: 44.3 % (ref 39.0–52.0)
Hemoglobin: 14.7 g/dL (ref 13.0–17.0)
Immature Granulocytes: 0 %
Lymphocytes Relative: 27 %
Lymphs Abs: 1.8 10*3/uL (ref 0.7–4.0)
MCH: 30.1 pg (ref 26.0–34.0)
MCHC: 33.2 g/dL (ref 30.0–36.0)
MCV: 90.8 fL (ref 80.0–100.0)
Monocytes Absolute: 0.4 10*3/uL (ref 0.1–1.0)
Monocytes Relative: 6 %
Neutro Abs: 4.2 10*3/uL (ref 1.7–7.7)
Neutrophils Relative %: 64 %
Platelets: 264 10*3/uL (ref 150–400)
RBC: 4.88 MIL/uL (ref 4.22–5.81)
RDW: 13.3 % (ref 11.5–15.5)
WBC: 6.6 10*3/uL (ref 4.0–10.5)
nRBC: 0 % (ref 0.0–0.2)

## 2020-07-13 LAB — COMPREHENSIVE METABOLIC PANEL
ALT: 44 U/L (ref 0–44)
AST: 25 U/L (ref 15–41)
Albumin: 4.1 g/dL (ref 3.5–5.0)
Alkaline Phosphatase: 54 U/L (ref 38–126)
Anion gap: 7 (ref 5–15)
BUN: 11 mg/dL (ref 6–20)
CO2: 26 mmol/L (ref 22–32)
Calcium: 8.9 mg/dL (ref 8.9–10.3)
Chloride: 103 mmol/L (ref 98–111)
Creatinine, Ser: 0.85 mg/dL (ref 0.61–1.24)
GFR, Estimated: >60 mL/min (ref >60)
Glucose, Bld: 271 mg/dL — ABNORMAL HIGH (ref 70–99)
Potassium: 3.9 mmol/L (ref 3.5–5.1)
Sodium: 136 mmol/L (ref 135–145)
Total Bilirubin: 0.9 mg/dL (ref 0.3–1.2)
Total Protein: 7.3 g/dL (ref 6.5–8.1)

## 2020-07-13 LAB — TYPE AND SCREEN
ABO/RH(D): AB POS
Antibody Screen: NEGATIVE

## 2020-07-13 LAB — PROTIME-INR
INR: 1 (ref 0.8–1.2)
Prothrombin Time: 13 seconds (ref 11.4–15.2)

## 2020-07-13 LAB — HEMOGLOBIN A1C
Hgb A1c MFr Bld: 5.8 % — ABNORMAL HIGH (ref 4.8–5.6)
Mean Plasma Glucose: 119.76 mg/dL

## 2020-07-13 NOTE — Consult Note (Signed)
[facility] Nurse requested for preoperative stoma site marking Patient with Crohn's disease/ulcerative colitis requesting marking for an end ileostomy/J pouch.  I will mark on both sides today.   Discussed surgical procedure and stoma creation with patient.  Wife is not present today but will be involved in care.  Explained role of the [facility] nurse team.  Provided the patient with educational booklet and provided samples of pouching options.  Answered patient and family questions.   Examined patient lying, sitting, and standing in order to place the marking in the patient's visual field, away from any creases or abdominal contour issues and within the rectus muscle.  Patient wears his pants just below his umbilicus and a lower marking will likely interfere with his clothing.  Due to rounded abdomen, he cannot visualize a stoma below the umbilicus.  I mark him above the umbilicus and explain that the MD will place the stoma here if possible but sometimes, a lower stoma is necessary. He understands.  Marked for colostomy in the LLQ  4 cm to the left of the umbilicus and 4 cm above the umbilicus.  Marked for ileostomy in the RLQ  4 cm to the right of the umbilicus and  4 cm above the umbilicus.  Patient's abdomen cleansed with CHG wipes at site markings, allowed to air dry prior to marking.Covered mark with thin film transparent dressing to preserve mark until date of surgery.   [facility] Nurse team will follow up with patient after surgery for continue ostomy care and teaching.    Domenic Moras MSN, RN, FNP-BC [facility] Wound, Ostomy, Continence Nurse Pager [phone]

## 2020-07-13 NOTE — Progress Notes (Signed)
   07/13/20 0937  OBSTRUCTIVE SLEEP APNEA  Have you ever been diagnosed with sleep apnea through a sleep study? No  Do you snore loudly (loud enough to be heard through closed doors)?  1  Do you often feel tired, fatigued, or sleepy during the daytime (such as falling asleep during driving or talking to someone)? 0  Has anyone observed you stop breathing during your sleep? 0  Do you have, or are you being treated for high blood pressure? 1  BMI more than 35 kg/m2? 1  Age > 50 (1-yes) 0  Neck circumference greater than:Male 16 inches or larger, Male 17inches or larger? 1  Male Gender (Yes=1) 1  Obstructive Sleep Apnea Score 5  Score 5 or greater  Results sent to PCP

## 2020-07-18 ENCOUNTER — Other Ambulatory Visit: Payer: Self-pay | Admitting: Cardiology

## 2020-07-18 DIAGNOSIS — I10 Essential (primary) hypertension: Secondary | ICD-10-CM

## 2020-07-21 ENCOUNTER — Other Ambulatory Visit (HOSPITAL_COMMUNITY)
Admission: RE | Admit: 2020-07-21 | Discharge: 2020-07-21 | Disposition: A | Discharge status: Home / Self Care | Payer: 59 | Source: Ambulatory Visit | Attending: Surgery | Admitting: Surgery

## 2020-07-21 DIAGNOSIS — Z20822 Contact with and (suspected) exposure to covid-19: Secondary | ICD-10-CM | POA: Insufficient documentation

## 2020-07-21 DIAGNOSIS — Z01812 Encounter for preprocedural laboratory examination: Secondary | ICD-10-CM | POA: Insufficient documentation

## 2020-07-21 LAB — SARS CORONAVIRUS 2 (TAT 6-24 HRS): SARS Coronavirus 2: NEGATIVE

## 2020-07-23 MED ORDER — BUPIVACAINE LIPOSOME 1.3 % IJ SUSP
20.0000 mL | Freq: Once | INTRAMUSCULAR | Status: DC
  Filled 2020-07-23: qty 20

## 2020-07-24 ENCOUNTER — Other Ambulatory Visit: Payer: Self-pay | Admitting: Surgery

## 2020-07-24 ENCOUNTER — Encounter (HOSPITAL_COMMUNITY): Payer: Self-pay | Admitting: Surgery

## 2020-07-24 ENCOUNTER — Encounter (HOSPITAL_COMMUNITY)
Admission: RE | Disposition: A | Discharge status: Home Health | Payer: Self-pay | Source: Home / Self Care | Attending: Surgery

## 2020-07-24 ENCOUNTER — Inpatient Hospital Stay (HOSPITAL_COMMUNITY): Payer: 59 | Admitting: Certified Registered"

## 2020-07-24 ENCOUNTER — Inpatient Hospital Stay (HOSPITAL_COMMUNITY)
Admission: RE | Admit: 2020-07-24 | Discharge: 2020-07-29 | DRG: 330 | Disposition: A | Discharge status: Home Health | Payer: 59 | Attending: Surgery | Admitting: Surgery

## 2020-07-24 ENCOUNTER — Inpatient Hospital Stay (HOSPITAL_COMMUNITY): Payer: 59 | Admitting: Physician Assistant

## 2020-07-24 DIAGNOSIS — Z8249 Family history of ischemic heart disease and other diseases of the circulatory system: Secondary | ICD-10-CM

## 2020-07-24 DIAGNOSIS — E782 Mixed hyperlipidemia: Secondary | ICD-10-CM | POA: Diagnosis not present

## 2020-07-24 DIAGNOSIS — E785 Hyperlipidemia, unspecified: Secondary | ICD-10-CM | POA: Diagnosis not present

## 2020-07-24 DIAGNOSIS — K5150 Left sided colitis without complications: Secondary | ICD-10-CM | POA: Diagnosis not present

## 2020-07-24 DIAGNOSIS — Z9049 Acquired absence of other specified parts of digestive tract: Secondary | ICD-10-CM

## 2020-07-24 DIAGNOSIS — I10 Essential (primary) hypertension: Secondary | ICD-10-CM | POA: Diagnosis not present

## 2020-07-24 DIAGNOSIS — Z20822 Contact with and (suspected) exposure to covid-19: Secondary | ICD-10-CM | POA: Diagnosis present

## 2020-07-24 DIAGNOSIS — C187 Malignant neoplasm of sigmoid colon: Secondary | ICD-10-CM | POA: Diagnosis not present

## 2020-07-24 DIAGNOSIS — K388 Other specified diseases of appendix: Secondary | ICD-10-CM | POA: Diagnosis not present

## 2020-07-24 DIAGNOSIS — K5190 Ulcerative colitis, unspecified, without complications: Secondary | ICD-10-CM | POA: Diagnosis present

## 2020-07-24 DIAGNOSIS — Z932 Ileostomy status: Secondary | ICD-10-CM

## 2020-07-24 DIAGNOSIS — C189 Malignant neoplasm of colon, unspecified: Secondary | ICD-10-CM | POA: Diagnosis not present

## 2020-07-24 HISTORY — PX: XI ROBOTIC ASSISTED LOWER ANTERIOR RESECTION: SHX6558

## 2020-07-24 HISTORY — PX: DIVERTING ILEOSTOMY: SHX5799

## 2020-07-24 HISTORY — PX: ILEAL POUCH: SHX5856

## 2020-07-24 LAB — ABO/RH: ABO/RH(D): AB POS

## 2020-07-24 SURGERY — RESECTION, RECTUM, LOW ANTERIOR, ROBOT-ASSISTED
Anesthesia: General | Site: Abdomen

## 2020-07-24 MED ORDER — DEXAMETHASONE SODIUM PHOSPHATE 10 MG/ML IJ SOLN
INTRAMUSCULAR | Status: DC | PRN
  Administered 2020-07-24: 10 mg via INTRAVENOUS

## 2020-07-24 MED ORDER — SUGAMMADEX SODIUM 200 MG/2ML IV SOLN
INTRAVENOUS | Status: DC | PRN
  Administered 2020-07-24: 200 mg via INTRAVENOUS

## 2020-07-24 MED ORDER — HEPARIN SODIUM (PORCINE) 5000 UNIT/ML IJ SOLN
5000.0000 [IU] | Freq: Once | INTRAMUSCULAR | Status: AC
  Administered 2020-07-24: 5000 [IU] via SUBCUTANEOUS
  Filled 2020-07-24: qty 1

## 2020-07-24 MED ORDER — PROMETHAZINE HCL 25 MG/ML IJ SOLN: 6.2500 mg | INTRAMUSCULAR | Status: DC | PRN

## 2020-07-24 MED ORDER — HYDRALAZINE HCL 20 MG/ML IJ SOLN: 10.0000 mg | INTRAMUSCULAR | Status: DC | PRN

## 2020-07-24 MED ORDER — ACETAMINOPHEN 500 MG PO TABS
1000.0000 mg | ORAL_TABLET | Freq: Four times a day (QID) | ORAL | Status: DC
  Administered 2020-07-24 – 2020-07-29 (×16): 1000 mg via ORAL
  Filled 2020-07-24 (×18): qty 2

## 2020-07-24 MED ORDER — ALBUMIN HUMAN 5 % IV SOLN: INTRAVENOUS | Status: DC | PRN

## 2020-07-24 MED ORDER — DIPHENHYDRAMINE HCL 50 MG/ML IJ SOLN: 12.5000 mg | Freq: Four times a day (QID) | INTRAMUSCULAR | Status: DC | PRN

## 2020-07-24 MED ORDER — HYDROMORPHONE HCL 1 MG/ML IJ SOLN
0.2500 mg | INTRAMUSCULAR | Status: DC | PRN
  Administered 2020-07-24 (×2): 0.5 mg via INTRAVENOUS

## 2020-07-24 MED ORDER — ACETAMINOPHEN 500 MG PO TABS
1000.0000 mg | ORAL_TABLET | ORAL | Status: AC
  Administered 2020-07-24: 1000 mg via ORAL
  Filled 2020-07-24: qty 2

## 2020-07-24 MED ORDER — PROPOFOL 10 MG/ML IV BOLUS
INTRAVENOUS | Status: AC
  Filled 2020-07-24: qty 20

## 2020-07-24 MED ORDER — ONDANSETRON HCL 4 MG/2ML IJ SOLN
INTRAMUSCULAR | Status: DC | PRN
  Administered 2020-07-24: 4 mg via INTRAVENOUS

## 2020-07-24 MED ORDER — FENTANYL CITRATE (PF) 100 MCG/2ML IJ SOLN
INTRAMUSCULAR | Status: AC
  Filled 2020-07-24: qty 2

## 2020-07-24 MED ORDER — KETAMINE HCL 10 MG/ML IJ SOLN
INTRAMUSCULAR | Status: AC
  Filled 2020-07-24: qty 1

## 2020-07-24 MED ORDER — LIDOCAINE 2% (20 MG/ML) 5 ML SYRINGE
INTRAMUSCULAR | Status: AC
  Filled 2020-07-24: qty 5

## 2020-07-24 MED ORDER — DEXMEDETOMIDINE (PRECEDEX) IN NS 20 MCG/5ML (4 MCG/ML) IV SYRINGE
PREFILLED_SYRINGE | INTRAVENOUS | Status: DC | PRN
  Administered 2020-07-24 (×4): 4 ug via INTRAVENOUS

## 2020-07-24 MED ORDER — ENSURE SURGERY PO LIQD
237.0000 mL | Freq: Two times a day (BID) | ORAL | Status: DC
  Administered 2020-07-25 – 2020-07-27 (×6): 237 mL via ORAL

## 2020-07-24 MED ORDER — MIDAZOLAM HCL 2 MG/2ML IJ SOLN
INTRAMUSCULAR | Status: AC
  Filled 2020-07-24: qty 2

## 2020-07-24 MED ORDER — ALVIMOPAN 12 MG PO CAPS
12.0000 mg | ORAL_CAPSULE | ORAL | Status: AC
  Administered 2020-07-24: 12 mg via ORAL
  Filled 2020-07-24: qty 1

## 2020-07-24 MED ORDER — ALUM & MAG HYDROXIDE-SIMETH 200-200-20 MG/5ML PO SUSP: 30.0000 mL | Freq: Four times a day (QID) | ORAL | Status: DC | PRN

## 2020-07-24 MED ORDER — LIDOCAINE HCL 2 % IJ SOLN
INTRAMUSCULAR | Status: AC
  Filled 2020-07-24: qty 20

## 2020-07-24 MED ORDER — HYDROMORPHONE HCL 1 MG/ML IJ SOLN
0.5000 mg | INTRAMUSCULAR | Status: DC | PRN
  Administered 2020-07-24: 0.5 mg via INTRAVENOUS
  Filled 2020-07-24: qty 0.5

## 2020-07-24 MED ORDER — BUPIVACAINE LIPOSOME 1.3 % IJ SUSP
INTRAMUSCULAR | Status: DC | PRN
  Administered 2020-07-24: 20 mL

## 2020-07-24 MED ORDER — BUPIVACAINE-EPINEPHRINE (PF) 0.25% -1:200000 IJ SOLN
INTRAMUSCULAR | Status: AC
  Filled 2020-07-24: qty 30

## 2020-07-24 MED ORDER — FENTANYL CITRATE (PF) 250 MCG/5ML IJ SOLN
INTRAMUSCULAR | Status: AC
  Filled 2020-07-24: qty 5

## 2020-07-24 MED ORDER — PHENYLEPHRINE 40 MCG/ML (10ML) SYRINGE FOR IV PUSH (FOR BLOOD PRESSURE SUPPORT)
PREFILLED_SYRINGE | INTRAVENOUS | Status: DC | PRN
  Administered 2020-07-24: 80 ug via INTRAVENOUS

## 2020-07-24 MED ORDER — 0.9 % SODIUM CHLORIDE (POUR BTL) OPTIME
TOPICAL | Status: DC | PRN
  Administered 2020-07-24: 2000 mL

## 2020-07-24 MED ORDER — ONDANSETRON HCL 4 MG PO TABS: 4.0000 mg | ORAL_TABLET | Freq: Four times a day (QID) | ORAL | Status: DC | PRN

## 2020-07-24 MED ORDER — METRONIDAZOLE 500 MG PO TABS: 1000.0000 mg | ORAL_TABLET | ORAL | Status: DC

## 2020-07-24 MED ORDER — SIMETHICONE 80 MG PO CHEW: 40.0000 mg | CHEWABLE_TABLET | Freq: Four times a day (QID) | ORAL | Status: DC | PRN

## 2020-07-24 MED ORDER — ALVIMOPAN 12 MG PO CAPS
12.0000 mg | ORAL_CAPSULE | Freq: Two times a day (BID) | ORAL | Status: AC
  Administered 2020-07-25 – 2020-07-26 (×3): 12 mg via ORAL
  Filled 2020-07-24 (×3): qty 1

## 2020-07-24 MED ORDER — POLYETHYLENE GLYCOL 3350 17 GM/SCOOP PO POWD: 1.0000 | Freq: Once | ORAL | Status: DC

## 2020-07-24 MED ORDER — FENTANYL CITRATE (PF) 100 MCG/2ML IJ SOLN
INTRAMUSCULAR | Status: DC | PRN
  Administered 2020-07-24: 50 ug via INTRAVENOUS
  Administered 2020-07-24: 100 ug via INTRAVENOUS
  Administered 2020-07-24 (×6): 50 ug via INTRAVENOUS

## 2020-07-24 MED ORDER — MIDAZOLAM HCL 5 MG/5ML IJ SOLN
INTRAMUSCULAR | Status: DC | PRN
  Administered 2020-07-24: 2 mg via INTRAVENOUS

## 2020-07-24 MED ORDER — DIPHENHYDRAMINE HCL 12.5 MG/5ML PO ELIX
12.5000 mg | ORAL_SOLUTION | Freq: Four times a day (QID) | ORAL | Status: DC | PRN
Start: 2020-07-24 — End: 2020-07-29

## 2020-07-24 MED ORDER — HYDROMORPHONE HCL 1 MG/ML IJ SOLN
INTRAMUSCULAR | Status: AC
  Filled 2020-07-24: qty 1

## 2020-07-24 MED ORDER — LOSARTAN POTASSIUM-HCTZ 50-12.5 MG PO TABS: 1.0000 | ORAL_TABLET | Freq: Every day | ORAL | Status: DC

## 2020-07-24 MED ORDER — LACTATED RINGERS IV SOLN: INTRAVENOUS | Status: DC

## 2020-07-24 MED ORDER — HYDROCHLOROTHIAZIDE 12.5 MG PO CAPS
12.5000 mg | ORAL_CAPSULE | Freq: Every day | ORAL | Status: DC
  Administered 2020-07-25 – 2020-07-29 (×5): 12.5 mg via ORAL
  Filled 2020-07-24 (×5): qty 1

## 2020-07-24 MED ORDER — BISACODYL 5 MG PO TBEC
20.0000 mg | DELAYED_RELEASE_TABLET | Freq: Once | ORAL | Status: DC
Start: 2020-07-24 — End: 2020-07-24

## 2020-07-24 MED ORDER — ROCURONIUM BROMIDE 10 MG/ML (PF) SYRINGE
PREFILLED_SYRINGE | INTRAVENOUS | Status: DC | PRN
  Administered 2020-07-24 (×2): 10 mg via INTRAVENOUS
  Administered 2020-07-24 (×4): 20 mg via INTRAVENOUS
  Administered 2020-07-24: 10 mg via INTRAVENOUS
  Administered 2020-07-24: 20 mg via INTRAVENOUS
  Administered 2020-07-24: 10 mg via INTRAVENOUS
  Administered 2020-07-24: 20 mg via INTRAVENOUS
  Administered 2020-07-24: 60 mg via INTRAVENOUS

## 2020-07-24 MED ORDER — BUPIVACAINE-EPINEPHRINE (PF) 0.25% -1:200000 IJ SOLN
INTRAMUSCULAR | Status: DC | PRN
  Administered 2020-07-24: 30 mL via PERINEURAL

## 2020-07-24 MED ORDER — TRAMADOL HCL 50 MG PO TABS
50.0000 mg | ORAL_TABLET | Freq: Four times a day (QID) | ORAL | Status: DC | PRN
  Administered 2020-07-25 (×2): 50 mg via ORAL
  Filled 2020-07-24 (×2): qty 1

## 2020-07-24 MED ORDER — CHLORHEXIDINE GLUCONATE CLOTH 2 % EX PADS: 6.0000 | MEDICATED_PAD | Freq: Once | CUTANEOUS | Status: DC

## 2020-07-24 MED ORDER — LOSARTAN POTASSIUM 50 MG PO TABS
50.0000 mg | ORAL_TABLET | Freq: Every day | ORAL | Status: DC
  Administered 2020-07-25 – 2020-07-29 (×5): 50 mg via ORAL
  Filled 2020-07-24 (×5): qty 1

## 2020-07-24 MED ORDER — OXYCODONE HCL 5 MG/5ML PO SOLN: 5.0000 mg | Freq: Once | ORAL | Status: DC | PRN

## 2020-07-24 MED ORDER — NEOMYCIN SULFATE 500 MG PO TABS: 1000.0000 mg | ORAL_TABLET | ORAL | Status: DC

## 2020-07-24 MED ORDER — CHLORHEXIDINE GLUCONATE CLOTH 2 % EX PADS
6.0000 | MEDICATED_PAD | Freq: Every day | CUTANEOUS | Status: DC
  Administered 2020-07-24 – 2020-07-29 (×3): 6 via TOPICAL

## 2020-07-24 MED ORDER — ALBUMIN HUMAN 5 % IV SOLN
INTRAVENOUS | Status: AC
  Filled 2020-07-24: qty 250

## 2020-07-24 MED ORDER — MEPERIDINE HCL 50 MG/ML IJ SOLN: 6.2500 mg | INTRAMUSCULAR | Status: DC | PRN

## 2020-07-24 MED ORDER — IBUPROFEN 400 MG PO TABS
600.0000 mg | ORAL_TABLET | Freq: Four times a day (QID) | ORAL | Status: DC | PRN
  Administered 2020-07-25 – 2020-07-26 (×3): 600 mg via ORAL
  Filled 2020-07-24 (×4): qty 1

## 2020-07-24 MED ORDER — AMISULPRIDE (ANTIEMETIC) 5 MG/2ML IV SOLN: 10.0000 mg | Freq: Once | INTRAVENOUS | Status: DC | PRN

## 2020-07-24 MED ORDER — ROCURONIUM BROMIDE 10 MG/ML (PF) SYRINGE
PREFILLED_SYRINGE | INTRAVENOUS | Status: AC
  Filled 2020-07-24: qty 10

## 2020-07-24 MED ORDER — ENSURE PRE-SURGERY PO LIQD
296.0000 mL | Freq: Once | ORAL | Status: DC
  Filled 2020-07-24: qty 296

## 2020-07-24 MED ORDER — ROSUVASTATIN CALCIUM 20 MG PO TABS
20.0000 mg | ORAL_TABLET | Freq: Every day | ORAL | Status: DC
  Administered 2020-07-25 – 2020-07-29 (×5): 20 mg via ORAL
  Filled 2020-07-24 (×5): qty 1

## 2020-07-24 MED ORDER — CHLORHEXIDINE GLUCONATE 0.12 % MT SOLN
15.0000 mL | Freq: Once | OROMUCOSAL | Status: AC
  Administered 2020-07-24: 15 mL via OROMUCOSAL

## 2020-07-24 MED ORDER — PROPOFOL 10 MG/ML IV BOLUS
INTRAVENOUS | Status: DC | PRN
  Administered 2020-07-24: 180 mg via INTRAVENOUS

## 2020-07-24 MED ORDER — ALBUTEROL SULFATE HFA 108 (90 BASE) MCG/ACT IN AERS
INHALATION_SPRAY | RESPIRATORY_TRACT | Status: AC
  Filled 2020-07-24: qty 6.7

## 2020-07-24 MED ORDER — ONDANSETRON HCL 4 MG/2ML IJ SOLN: 4.0000 mg | Freq: Four times a day (QID) | INTRAMUSCULAR | Status: DC | PRN

## 2020-07-24 MED ORDER — DEXAMETHASONE SODIUM PHOSPHATE 10 MG/ML IJ SOLN
INTRAMUSCULAR | Status: AC
  Filled 2020-07-24: qty 1

## 2020-07-24 MED ORDER — DEXMEDETOMIDINE (PRECEDEX) IN NS 20 MCG/5ML (4 MCG/ML) IV SYRINGE
PREFILLED_SYRINGE | INTRAVENOUS | Status: AC
  Filled 2020-07-24: qty 5

## 2020-07-24 MED ORDER — ALBUTEROL SULFATE HFA 108 (90 BASE) MCG/ACT IN AERS
INHALATION_SPRAY | RESPIRATORY_TRACT | Status: DC | PRN
  Administered 2020-07-24 (×3): 2 via RESPIRATORY_TRACT

## 2020-07-24 MED ORDER — ORAL CARE MOUTH RINSE: 15.0000 mL | Freq: Once | OROMUCOSAL | Status: AC

## 2020-07-24 MED ORDER — SODIUM CHLORIDE 0.9 % IV SOLN
2.0000 g | INTRAVENOUS | Status: AC
  Administered 2020-07-24 (×2): 2 g via INTRAVENOUS
  Filled 2020-07-24: qty 2

## 2020-07-24 MED ORDER — OXYCODONE HCL 5 MG PO TABS
5.0000 mg | ORAL_TABLET | Freq: Once | ORAL | Status: DC | PRN
Start: 2020-07-24 — End: 2020-07-24

## 2020-07-24 MED ORDER — HEPARIN SODIUM (PORCINE) 5000 UNIT/ML IJ SOLN
5000.0000 [IU] | Freq: Three times a day (TID) | INTRAMUSCULAR | Status: DC
  Administered 2020-07-25 – 2020-07-28 (×11): 5000 [IU] via SUBCUTANEOUS
  Filled 2020-07-24 (×11): qty 1

## 2020-07-24 MED ORDER — ENSURE PRE-SURGERY PO LIQD
592.0000 mL | Freq: Once | ORAL | Status: DC
  Filled 2020-07-24: qty 592

## 2020-07-24 MED ORDER — SODIUM CHLORIDE 0.9 % IV SOLN
INTRAVENOUS | Status: AC
  Filled 2020-07-24: qty 2

## 2020-07-24 MED ORDER — LACTATED RINGERS IR SOLN
Status: DC | PRN
  Administered 2020-07-24: 1000 mL

## 2020-07-24 MED ORDER — ONDANSETRON HCL 4 MG/2ML IJ SOLN
INTRAMUSCULAR | Status: AC
  Filled 2020-07-24: qty 2

## 2020-07-24 MED ORDER — LIDOCAINE 2% (20 MG/ML) 5 ML SYRINGE
INTRAMUSCULAR | Status: DC | PRN
  Administered 2020-07-24: 80 mg via INTRAVENOUS

## 2020-07-24 MED ORDER — LIDOCAINE 20MG/ML (2%) 15 ML SYRINGE OPTIME
INTRAMUSCULAR | Status: DC | PRN
  Administered 2020-07-24: 1.5 mg/kg/h via INTRAVENOUS

## 2020-07-24 SURGICAL SUPPLY — 116 items
APPLIER CLIP 5 13 M/L LIGAMAX5 (MISCELLANEOUS)
APPLIER CLIP ROT 10 11.4 M/L (STAPLE)
BLADE EXTENDED COATED 6.5IN (ELECTRODE) ×2 IMPLANT
CANNULA REDUC XI 12-8 STAPL (CANNULA) ×1
CANNULA REDUCER 12-8 DVNC XI (CANNULA) ×1 IMPLANT
CELLS DAT CNTRL 66122 CELL SVR (MISCELLANEOUS) IMPLANT
CHLORAPREP W/TINT 26 (MISCELLANEOUS) ×2 IMPLANT
CLIP APPLIE 5 13 M/L LIGAMAX5 (MISCELLANEOUS) IMPLANT
CLIP APPLIE ROT 10 11.4 M/L (STAPLE) IMPLANT
CLIP VESOLOCK LG 6/CT PURPLE (CLIP) IMPLANT
CLIP VESOLOCK MED LG 6/CT (CLIP) IMPLANT
COVER SURGICAL LIGHT HANDLE (MISCELLANEOUS) ×4 IMPLANT
COVER TIP SHEARS 8 DVNC (MISCELLANEOUS) ×1 IMPLANT
COVER TIP SHEARS 8MM DA VINCI (MISCELLANEOUS) ×1
COVER WAND RF STERILE (DRAPES) IMPLANT
CUTTER FLEX LINEAR 45M (STAPLE) ×2 IMPLANT
DECANTER SPIKE VIAL GLASS SM (MISCELLANEOUS) ×2 IMPLANT
DEVICE TROCAR PUNCTURE CLOSURE (ENDOMECHANICALS) IMPLANT
DRAIN CHANNEL 19F RND (DRAIN) ×2 IMPLANT
DRAPE ARM DVNC X/XI (DISPOSABLE) ×4 IMPLANT
DRAPE COLUMN DVNC XI (DISPOSABLE) ×1 IMPLANT
DRAPE DA VINCI XI ARM (DISPOSABLE) ×4
DRAPE DA VINCI XI COLUMN (DISPOSABLE) ×1
DRAPE SURG IRRIG POUCH 19X23 (DRAPES) ×2 IMPLANT
DRSG OPSITE POSTOP 3X4 (GAUZE/BANDAGES/DRESSINGS) ×2 IMPLANT
DRSG OPSITE POSTOP 4X10 (GAUZE/BANDAGES/DRESSINGS) IMPLANT
DRSG OPSITE POSTOP 4X6 (GAUZE/BANDAGES/DRESSINGS) IMPLANT
DRSG OPSITE POSTOP 4X8 (GAUZE/BANDAGES/DRESSINGS) IMPLANT
DRSG TEGADERM 2-3/8X2-3/4 SM (GAUZE/BANDAGES/DRESSINGS) ×10 IMPLANT
DRSG TEGADERM 4X4.75 (GAUZE/BANDAGES/DRESSINGS) ×2 IMPLANT
ELECT REM PT RETURN 15FT ADLT (MISCELLANEOUS) ×2 IMPLANT
ENDOLOOP SUT PDS II  0 18 (SUTURE)
ENDOLOOP SUT PDS II 0 18 (SUTURE) IMPLANT
EVACUATOR SILICONE 100CC (DRAIN) ×2 IMPLANT
GAUZE SPONGE 2X2 8PLY STRL LF (GAUZE/BANDAGES/DRESSINGS) ×1 IMPLANT
GAUZE SPONGE 4X4 12PLY STRL (GAUZE/BANDAGES/DRESSINGS) IMPLANT
GLOVE SURG ENC MOIS LTX SZ7.5 (GLOVE) ×6 IMPLANT
GLOVE SURG UNDER LTX SZ8 (GLOVE) ×6 IMPLANT
GOWN STRL REUS W/TWL XL LVL3 (GOWN DISPOSABLE) ×10 IMPLANT
GRASPER SUT TROCAR 14GX15 (MISCELLANEOUS) ×2 IMPLANT
HOLDER FOLEY CATH W/STRAP (MISCELLANEOUS) ×2 IMPLANT
KIT PROCEDURE DA VINCI SI (MISCELLANEOUS)
KIT PROCEDURE DVNC SI (MISCELLANEOUS) IMPLANT
KIT TURNOVER KIT A (KITS) ×2 IMPLANT
NEEDLE INSUFFLATION 14GA 120MM (NEEDLE) ×2 IMPLANT
PACK CARDIOVASCULAR III (CUSTOM PROCEDURE TRAY) ×2 IMPLANT
PACK COLON (CUSTOM PROCEDURE TRAY) ×2 IMPLANT
PAD POSITIONING PINK XL (MISCELLANEOUS) ×2 IMPLANT
PENCIL SMOKE EVACUATOR (MISCELLANEOUS) IMPLANT
PORT LAP GEL ALEXIS MED 5-9CM (MISCELLANEOUS) ×2 IMPLANT
PROTECTOR NERVE ULNAR (MISCELLANEOUS) ×4 IMPLANT
RELOAD STAPLE TA45 3.5 REG BLU (ENDOMECHANICALS) ×6 IMPLANT
RELOAD STAPLER 3.5X45 BLU DVNC (STAPLE) IMPLANT
RELOAD STAPLER 3.5X60 BLU DVNC (STAPLE) ×3 IMPLANT
RELOAD STAPLER 4.3X45 GRN DVNC (STAPLE) IMPLANT
RELOAD STAPLER 4.3X60 GRN DVNC (STAPLE) IMPLANT
RTRCTR WOUND ALEXIS 18CM MED (MISCELLANEOUS)
SCISSORS LAP 5X35 DISP (ENDOMECHANICALS) IMPLANT
SEAL CANN UNIV 5-8 DVNC XI (MISCELLANEOUS) ×4 IMPLANT
SEAL XI 5MM-8MM UNIVERSAL (MISCELLANEOUS) ×4
SEALER TISSUE G2 CVD JAW 35 (ENDOMECHANICALS) ×1 IMPLANT
SEALER TISSUE G2 CVD JAW 45CM (ENDOMECHANICALS) ×1
SEALER VESSEL DA VINCI XI (MISCELLANEOUS) ×1
SEALER VESSEL EXT DVNC XI (MISCELLANEOUS) ×1 IMPLANT
SET IRRIG TUBING LAPAROSCOPIC (IRRIGATION / IRRIGATOR) ×2 IMPLANT
SLEEVE ADV FIXATION 5X100MM (TROCAR) ×2 IMPLANT
SOLUTION ELECTROLUBE (MISCELLANEOUS) ×2 IMPLANT
SPONGE GAUZE 2X2 STER 10/PKG (GAUZE/BANDAGES/DRESSINGS) ×1
STAPLER 60 DA VINCI SURE FORM (STAPLE) ×1
STAPLER 60 SUREFORM DVNC (STAPLE) ×1 IMPLANT
STAPLER CANNULA SEAL DVNC XI (STAPLE) ×1 IMPLANT
STAPLER CANNULA SEAL XI (STAPLE) ×1
STAPLER CIRC 25MM 4.8MM THK (STAPLE) ×2 IMPLANT
STAPLER ECHELON POWER CIR 29 (STAPLE) IMPLANT
STAPLER ECHELON POWER CIR 31 (STAPLE) IMPLANT
STAPLER PROXIMATE 100MM BLUE (MISCELLANEOUS) ×2 IMPLANT
STAPLER RELOAD 3.5X45 BLU DVNC (STAPLE)
STAPLER RELOAD 3.5X45 BLUE (STAPLE)
STAPLER RELOAD 3.5X60 BLU DVNC (STAPLE) ×3
STAPLER RELOAD 3.5X60 BLUE (STAPLE) ×3
STAPLER RELOAD 4.3X45 GREEN (STAPLE)
STAPLER RELOAD 4.3X45 GRN DVNC (STAPLE)
STAPLER RELOAD 4.3X60 GREEN (STAPLE)
STAPLER RELOAD 4.3X60 GRN DVNC (STAPLE)
STAPLER SHEATH (SHEATH) ×1
STAPLER SHEATH ENDOWRIST DVNC (SHEATH) ×1 IMPLANT
STOPCOCK 4 WAY LG BORE MALE ST (IV SETS) ×4 IMPLANT
SURGILUBE 2OZ TUBE FLIPTOP (MISCELLANEOUS) ×2 IMPLANT
SUT MNCRL AB 4-0 PS2 18 (SUTURE) ×2 IMPLANT
SUT PDS AB 1 CT1 27 (SUTURE) IMPLANT
SUT PDS AB 1 TP1 96 (SUTURE) IMPLANT
SUT PROLENE 0 CT 2 (SUTURE) IMPLANT
SUT PROLENE 2 0 KS (SUTURE) ×2 IMPLANT
SUT PROLENE 2 0 SH DA (SUTURE) IMPLANT
SUT SILK 2 0 (SUTURE)
SUT SILK 2 0 SH CR/8 (SUTURE) IMPLANT
SUT SILK 2-0 18XBRD TIE 12 (SUTURE) IMPLANT
SUT SILK 3 0 (SUTURE) ×1
SUT SILK 3 0 SH CR/8 (SUTURE) ×2 IMPLANT
SUT SILK 3-0 18XBRD TIE 12 (SUTURE) ×1 IMPLANT
SUT V-LOC BARB 180 2/0GR6 GS22 (SUTURE)
SUT VIC AB 3-0 SH 18 (SUTURE) ×2 IMPLANT
SUT VIC AB 3-0 SH 27 (SUTURE)
SUT VIC AB 3-0 SH 27XBRD (SUTURE) IMPLANT
SUT VICRYL 0 UR6 27IN ABS (SUTURE) ×2 IMPLANT
SUTURE V-LC BRB 180 2/0GR6GS22 (SUTURE) IMPLANT
SYR 10ML LL (SYRINGE) ×2 IMPLANT
SYS LAPSCP GELPORT 120MM (MISCELLANEOUS)
SYSTEM LAPSCP GELPORT 120MM (MISCELLANEOUS) IMPLANT
TAPE UMBILICAL COTTON 1/8X30 (MISCELLANEOUS) ×2 IMPLANT
TOWEL OR NON WOVEN STRL DISP B (DISPOSABLE) ×2 IMPLANT
TRAY FOLEY MTR SLVR 16FR STAT (SET/KITS/TRAYS/PACK) ×2 IMPLANT
TROCAR ADV FIXATION 5X100MM (TROCAR) ×2 IMPLANT
TROCAR XCEL 12X100 BLDLESS (ENDOMECHANICALS) ×2 IMPLANT
TUBING CONNECTING 10 (TUBING) ×4 IMPLANT
TUBING INSUFFLATION 10FT LAP (TUBING) ×2 IMPLANT

## 2020-07-24 NOTE — Anesthesia Postprocedure Evaluation (Signed)
Anesthesia Post Note  Patient: Don Meyer  Procedure(s) Performed: ROBOTIC RESTOREATIVE PROCTOCOLECTOMY AND BILATERAL TAP BLOCK (N/A Abdomen) ILEAL J POUCH ANAL ANASTAMOSIS WITH POUCHOSCOPY (N/A Abdomen) DIVERTING LOOP ILEOSTOMY (N/A Abdomen)     Patient location during evaluation: PACU Anesthesia Type: General Level of consciousness: awake and alert Pain management: pain level controlled Vital Signs Assessment: post-procedure vital signs reviewed and stable Respiratory status: spontaneous breathing, nonlabored ventilation and respiratory function stable Cardiovascular status: blood pressure returned to baseline and stable Postop Assessment: no apparent nausea or vomiting Anesthetic complications: no   No complications documented.  Last Vitals:  Vitals:   07/24/20 1600 07/24/20 1615  BP: 128/90 127/90  Pulse: (!) 107 (!) 108  Resp: 17 17  Temp:    SpO2: 93% 94%    Last Pain:  Vitals:   07/24/20 1600  TempSrc:   PainSc: [place]

## 2020-07-24 NOTE — Op Note (Signed)
PATIENT: Don Meyer  45 y.o. male  Patient Care Team: Deland Pretty, MD as PCP - General (Internal Medicine)  PREOP DIAGNOSIS: ULCERATIVE COLITIS WITH ADENOCARCINOMA   POSTOP DIAGNOSIS: ULCERATIVE COLITIS WITH ADENOCARCINOMA  PROCEDURE:  1. Robotic-assisted restorative proctocolectomy with ileal J pouch 2. Diverting loop ileostomy 3. Pouchoscopy 4. Bilateral transversus abdominus plane blocks  SURGEON: Sharon Mt. Dema Severin, MD  ASSISTANT: Leighton Ruff, MD  ANESTHESIA: General endotracheal  EBL: 200 mL Total I/O In: [id number] [I.V.:2000; IV Piggyback:450] Out: 450 [Urine:250; Blood:200]  DRAINS: None  SPECIMEN: Terminal ileum, colon, rectum  COUNTS: Sponge, needle and instrument counts were reported correct x2  FINDINGS: Tattoo within the sigmoid colon as expected.  Restorative proctocolectomy with ileal pouch anal anastomosis and diverting loop ileostomy carried out.  The J-pouch sits between 1 and 2 cm from the anal verge with a small but healthy rectal cuff.  This is tension-free, airtight, well perfused. A 19 Fr round blake drain is left draining the pelvis.  NARRATIVE: Informed consent was verified. He was taken to the operating room, placed supine on the operating table and SCD's were applied. General endotracheal anesthesia was induced without difficulty. The patient was then positioned in the lithotomy position with Allen stirrups. Pressure points were then evaluated and padded.  A foley catheter was then placed by nursing under sterile conditions. Hair on the abdomen was clipped.  The abdomen was then prepped and draped in the standard sterile fashion. Surgical timeout was called indicating the correct patient, procedure, positioning and need for preoperative antibiotics.   An OG tube was placed by anesthesia and confirmed to be to suction.  At Palmer's point, a stab incision was created and the Veress needle was introduced into the peritoneal cavity on the first attempt.   Intraperitoneal location was confirmed by the aspiration and saline drop test.  Pneumoperitoneum was established to a maximum pressure of 15 mmHg using CO2.  Following this, the abdomen was marked for planned trocar sites.  Just to the right and cephalad to the umbilicus, an 8 mm incision was created and an 8 mm blunt tipped robotic trocar was cautiously placed into the peritoneal cavity.  The laparoscope was inserted and demonstrated no evidence of trocar site nor Veress needle site complications.  The Veress needle was removed.  Bilateral transversus abdominis plane blocks were then created using a dilute mixture of Exparel with Marcaine.  3 additional 8 mm robotic trochars were placed under direct visualization roughly in a straight line across the abdomen relative to the camera port in the supraumbilical position. The bladder was inspected and noted to be at/below the pubic symphysis.  Staying 3 fingerbreadths above the pubic symphysis, an incision was created and the 12 mm robotic trocar inserted directed cephalad into the peritoneal cavity under direct visualization.  An additional 5 mm assist port was placed in the right lateral abdomen under direct visualization. Ultimately during the case another 5 mm assist port was placed in the right upper abdomen as well under direct visualization to assist in retraction.  The abdomen was surveyed and there was tattoo in the sigmoid colon as expected.  There was also some omental adhesions to the left lower quadrant of the abdomen.  He was positioned in Trendelenburg with some left side up.  Small bowel was carefully retracted out of the pelvis.  The robot was then docked and I went to the console.   The sigmoid colon was readily identified.    Given the bulkiness of his  sigmoid mesocolon, a lateral to medial approach was first employed.  The sigmoid colon was mobilized off the intersigmoid fossa.  The left gonadal's and left ureter were readily identified in this  location within the retroperitoneum and protected free of injury.  These were maintained in the down plane.  The mesocolon was reflected anteriorly.  This was fully mobilized off the retroperitoneum.  The descending colon was also mobilized cephalad working up towards the splenic flexure.  Attention was then turned to the pelvis.  The rectosigmoid colon was elevated anteriorly and the peritoneum on the right side incised.  The TME plane was readily gained between the fascia propria of the rectum and the presacral fascia.  The hypogastric nerves were identified and protected free of injury by sweeping them down. This plane was developed posteriorly first working down into the deep pelvis.  The dissection then commenced laterally.  The left side was then approached by retracting the rectosigmoid colon to the right.  The peritoneum was incised.  The lateral dissection then commenced on the side beginning with connecting that to the posterior plane from the side.  The lateral attachments were then taken down to the rectum on the left side.  The right lateral attachments were also taken down.  The pelvic dissection then commenced posteriorly until we were through the rectosacral ligament and down to the level of the pelvic floor.  The dissection was then completed laterally and then finally anteriorly.  We were working anteriorly, the seminal vesicles were protected free of injury.  We maintained our TME plane of dissection circumferentially down to the level of the pelvic floor.  There was thinning of the mesorectum at this level as expected.  The rectum had been circumferentially dissected.  Attention was then turned to the IMA pedicle  The rectosigmoid colon was elevated anteriorly and the IMA readily identified.  Peritoneum overlying this was incised.  The IMA was then circumferentially dissected.  The left ureter and gonadal vessels were confirmed to be down.  The IMA had been circumferentially dissected.  This  was then divided using the vessel sealer.  The stump was inspected and noted to be hemostatic.  Attention was then turned to dividing the rectum.  I went below and did a rectal exam.  Our distal point of transection was approximately 1 to 2 cm above the anal verge.  A robotic stapler was introduced through the 12 mm suprapubic port.  The rectum was then divided using a blue load of the robotic stapler.  1 additional final firing was done using the laparoscopic 45 mm blue load stapler for angulation purposes in the deep pelvis of a narrow male.  The pelvis was irrigated and hemostasis verified.  The stump was intact and healthy in appearance.  Attention was then turned to mobilizing the terminal ileum and cecum given our robotic approach.  The appendix was mobilized off the right lower quadrant abdominal wall.  The cecum was retracted medially.  The Kathrynn Backstrom line of Toldt was incised facilitating medialization of both the cecum and ascending colon.  The terminal ileum was also mobilized medially.  Having gotten up to the level of the hepatic flexure on the right and the splenic flexure and the left, we at this point undocked the robot and redox the robot focusing on the upper abdomen.  The ascending colon was fully mobilized by taking down the hepatic flexure.  The lesser sac was entered on the mid transverse colon and the omentum taken off  the transverse colon both to the right and the left.  The hepatic flexure was then fully mobilized.  The duodenum was identified and protected free of injury.  This was swept down.  The associated mesocolon and terminal ileal mesentery was completely mobilized off the underlying duodenum.  The splenic flexure was then approached after we had fully developed our lesser sac dissection plane by taking the omentum off the transverse colon out to the level of the splenic flexure.  All attachments had been released.  We are able to access our mesenteric cut edge on the descending  colon just above our IMA pedicle.  The mesentery was then ligated using the vessel sealer up to and around the splenic flexure.  The ligament of Treitz was identified and the underlying jejunum and duodenum again protected.  The mesentery on the transverse colon was taken relatively close to the transverse colon.  Due to his habitus and degree of adiposity within his transverse mesocolon particularly on the right side, we elected to complete this portion of the transection of the mesentery extracorporeally.  The robot was undocked.  I scrubbed back in.  A supra umbilical extraction site incision was created by incising the skin and the underlying fascia.  Under pneumoperitoneum, the peritoneum was entered.  An [place] wound protector was placed.  The rectum, sigmoid, descending, and distal transverse colon were easily delivered.  Using the Enseal device, the remaining mesocolon on the proximal transverse colon was ligated and divided.  We came around the hepatic flexure and out to the cecum.  The associated mesentery was divided again staying close to the colon.  The terminal ileum was then approached.  A window was created in the mesentery at this level.  The distal ileum was then divided using the linear cutting GIA blue load stapler.  The remaining mesentery was divided.  The specimen was passed off.  Orientation was confirmed of the terminal ileum such that there is no twisting. The ileal mesentery has been mobilized off the duodenum. Reach of the distal ileum into the deep pelvis is confirmed. The location of the J pouch on the ileum was identified. A 15 cm pouch is planned for reach; any larger and it would potentially be under more tension on the mesentery. This is delivered back onto the field and orientation confirmed.  Attention was turned to creating the ileal J-pouch. An enterotomy is created transversely. The pouch is then created by firing blue load GIA staplers on the antimesenteric border of each  respective limb.  The tip of the J is inspected and noted to be well perfused in appearance.  The staple line is noted to be intact.  A 25 mm EEA stapler is selected.  The anvil is placed after creating a pursestring using 2-0 Prolene.  The pursestring is tied.  This is placed back into the abdomen.  Pneumoperitoneum was reestablished.  The pelvis is inspected and again hemostasis appreciated.  The J-pouch is placed in the pelvis and rests in that location without any tension.  I then went below.  Rectal exam confirmed appropriate cuff length.  The stapler was then introduced and the spike deployed just anterior/to the side of our staple line.  The components were then mated.  Orientation was confirmed such that there is no twisting of the ileum going into the pelvis.  The stapler was then closed, held, and fired.  The donuts were then inspected and noted to be complete and full.  I scrubbed back  in.  Attention was turned to creating the diverting loop ileostomy.  Inspection the location that would most appropriately reach this was in the mid to distal ileum.  The planned ileostomy site was sided supraumbilical and a bit far away given his J-pouch.  Therefore, using a transrectal approach, we opted to move this caudad into an infraumbilical location.  The skin was incised in a wheal.  The underlying subcutaneous tissue dissected.  The fascia was incised and the rectus muscle spread.  With the hand underneath, I was able to open the peritoneum carefully.  The ileum was brought through the incision and a stoma rod was placed to maintain its position. A 19 Fr round blake drain was placed to drain the pelvis and secured to the skin with 2-0 Nylon suture.  Attention was then turned to closing the extraction site fascia. Using two #1 PDS sutures, the fascia was closed.  Sponge, needle, and instrument counts were reported correct x2.  The skin of all incision sites was closed using 4-0 Monocryl subcuticular suture.   Dermabond was applied.  Honeycomb was applied to the midline.  Attention was then turned to maturing the ileostomy.  This is nice and pink.  This was incised and the proximal portion partially brooked as reach would allow using 3-0 Vicryl suture.  The distal limb was also secured to the dermal epidermal junction using 3-0 Vicryl suture. The stoma rod was secured to itself using 0 silk suture.  A stoma appliance was cut to fit.   He is taken out of lithotomy, awakened from anesthesia, extubated, and transferred to a stretcher for transport to PACU in satisfactory condition having tolerated the procedure well.

## 2020-07-24 NOTE — Anesthesia Preprocedure Evaluation (Signed)
Anesthesia Evaluation  Patient identified by MRN, date of birth, ID band Patient awake    Reviewed: Allergy & Precautions, NPO status , Patient's Chart, lab work & pertinent test results  Airway Mallampati: II  TM Distance: >3 FB Neck ROM: Full    Dental no notable dental hx.    Pulmonary neg pulmonary ROS,    Pulmonary exam normal breath sounds clear to auscultation       Cardiovascular hypertension, Pt. on medications negative cardio ROS Normal cardiovascular exam Rhythm:Regular Rate:Normal     Neuro/Psych negative neurological ROS  negative psych ROS   GI/Hepatic negative GI ROS, Neg liver ROS,   Endo/Other  negative endocrine ROS  Renal/GU negative Renal ROS  negative genitourinary   Musculoskeletal negative musculoskeletal ROS (+)   Abdominal (+) + obese,   Peds negative pediatric ROS (+)  Hematology negative hematology ROS (+)   Anesthesia Other Findings Crohn's  Reproductive/Obstetrics negative OB ROS                             Anesthesia Physical Anesthesia Plan  ASA: III  Anesthesia Plan: General   Post-op Pain Management:    Induction: Intravenous  PONV Risk Score and Plan: 2 and Ondansetron, Midazolam and Treatment may vary due to age or medical condition  Airway Management Planned: Oral ETT  Additional Equipment:   Intra-op Plan:   Post-operative Plan: Extubation in OR  Informed Consent: I have reviewed the patients History and Physical, chart, labs and discussed the procedure including the risks, benefits and alternatives for the proposed anesthesia with the patient or authorized representative who has indicated his/her understanding and acceptance.     Dental advisory given  Plan Discussed with: CRNA  Anesthesia Plan Comments:         Anesthesia Quick Evaluation

## 2020-07-24 NOTE — Transfer of Care (Signed)
Immediate Anesthesia Transfer of Care Note  Patient: Don Meyer  Procedure(s) Performed: ROBOTIC RESTOREATIVE PROCTOCOLECTOMY AND BILATERAL TAP BLOCK (N/A Abdomen) ILEAL J POUCH ANAL ANASTAMOSIS WITH POUCHOSCOPY (N/A Abdomen) DIVERTING LOOP ILEOSTOMY (N/A Abdomen)  Patient Location: PACU  Anesthesia Type:General  Level of Consciousness: awake, alert  and oriented  Airway & Oxygen Therapy: Patient Spontanous Breathing and Patient connected to face mask oxygen  Post-op Assessment: Report given to RN and Post -op Vital signs reviewed and stable  Post vital signs: Reviewed and stable  Last Vitals:  Vitals Value Taken Time  BP    Temp    Pulse 105 07/24/20 1532  Resp 16 07/24/20 1532  SpO2 96 % 07/24/20 1532  Vitals shown include unvalidated device data.  Last Pain:  Vitals:   07/24/20 0603  TempSrc: Oral  PainSc:       Patients Stated Pain Goal: 4 (05/69/79 4801)  Complications: No complications documented.

## 2020-07-24 NOTE — H&P (Signed)
CC: Referred by Dr. Benson Norway for sigmoidal polyp biopsied, adenocarcinoma in setting of long-standing ulcerative colitis - here for surgery today  HPI: Don Meyer is a very pleasant 48yoM who reports a 20+ year history of ulcer colitis. He states his first diagnosed back in his early 79s. Approximately 15 years ago he had been maintained on what he believes to be in his alanine and occasional steroids but for the last 15 years has been on no treatment. He reports he had a colonoscopy in 2007 which I do not have a copy of. He underwent a colonoscopy with Dr. Benson Norway 05/21/20 that demonstrated moderately active pan colitis consistent with ulcerative colitis with random biopsies. A polyp was found in the sigmoid colon that appeared worrisome in its appearance and therefore was just biopsied. This returned with adenocarcinoma. The other biopsies throughout his colon were also in the same bottle but returned with chronic colitis, mildly active. No dysplasia or malignancy. He underwent staging CT 05/29/20 chest/abdomen/pelvis which showed 2 tiny indeterminate low attenuating lesions in the liver. Liver metastases cannot definitely be excluded and MRI recommended. No other worrisome findings. He is scheduled for an MRI 06/08/20.  Of note, on his colonoscopy, he does have rather diffuse pan colitis which was characterized as mild in appearance but notable throughout his entire colon. He has no history of anorectal abscesses or fistula. He has no history of recurring aphthous ulcers or anal fissures.  He reports he has 1-2 BMs per day at baseline, well formed stool without blood. Denies abdominal pain or bloating.  He returned today for follow-up - he denies any changes in his health or health history. MRI abd w/without contrast was completed 06/08/20 - showed the liver lesions noted on his CT scan to be benign subcentimeter cysts without solid mass or suspicious enhancement. No evidence of metastatic disease  or lymphadenopathy. Some hepatic steatosis. He has had some time to think about everything and go over his options with his wife. He is interested in pursuing ileal J-pouch if possible. Path has been requested from [facility] for review with our GI pathologists - this confirmed their findings.  He has been well. States he is ready for surgery. Tolerated bowel prep with good result.  PMH: Ulcerative colitis  PSH: Denies  FHx: Denies FHx of colorectal, breast, endometrial, ovarian or cervical cancer. He also denies any known family history of IBD/Crohn's/ulcerative colitis.  Social: Denies use of tobacco/EtOH/drugs. He is here today with his wife. He works for the city of [place]: A comprehensive 10 system review of systems was completed with the patient and pertinent findings as noted above.  Past Medical History:  Diagnosis Date  . Colon cancer ([place])   . Hyperlipidemia   . Hypertension   . Ulcerative colitis [facility])     Past Surgical History:  Procedure Laterality Date  . COLONOSCOPY    . WISDOM TOOTH EXTRACTION      Family History  Problem Relation Age of Onset  . Heart disease Mother   . Drug abuse Brother     Social:  reports that he has never smoked. He has never used smokeless tobacco. He reports current alcohol use. He reports that he does not use drugs.  Allergies: No Known Allergies  Medications: I have reviewed the patient's current medications.  Results for orders placed or performed during the hospital encounter of 07/24/20 (from the past 48 hour(s))  ABO/Rh     Status: None   Collection Time: 07/24/20  6:05 AM  Result Value Ref Range   ABO/RH(D)      AB POS Performed at [facility], [place] [address], [place], [place] [address]     No results found.  ROS - all of the below systems have been reviewed with the patient and positives are indicated with bold text General: chills, fever or night sweats Eyes: blurry vision  or double vision ENT: epistaxis or sore throat Allergy/Immunology: itchy/watery eyes or nasal congestion Hematologic/Lymphatic: bleeding problems, blood clots or swollen lymph nodes Endocrine: temperature intolerance or unexpected weight changes Breast: new or changing breast lumps or nipple discharge Resp: cough, shortness of breath, or wheezing CV: chest pain or dyspnea on exertion GI: as per HPI GU: dysuria, trouble voiding, or hematuria MSK: joint pain or joint stiffness Neuro: TIA or stroke symptoms Derm: pruritus and skin lesion changes Psych: anxiety and depression  PE Blood pressure 120/87, pulse 84, temperature 98.6 F (37 C), temperature source Oral, resp. rate 18, height 5\' 8"  (1.727 m), weight 106.6 kg, SpO2 98 %. Constitutional: NAD; conversant Eyes: Moist conjunctiva; no lid lag; anicteric Lungs: Normal respiratory effort CV: RRR; no palpable thrills; no pitting edema GI: Abd soft, nontender, nondistended; stoma site marked; no palpable hepatosplenomegaly MSK: Normal range of motion of extremities; no clubbing/cyanosis Psychiatric: Appropriate affect; alert and oriented x3  Results for orders placed or performed during the hospital encounter of 07/24/20 (from the past 48 hour(s))  ABO/Rh     Status: None   Collection Time: 07/24/20  6:05 AM  Result Value Ref Range   ABO/RH(D)      AB POS Performed at [facility], [place] [address], [place], [place] [address]     No results found.   A/P: [name] is a very pleasant 89yoM with presumably long-standing ulcerative colitiis - now with sigmoid adenocarcinoma. CT CAP shows 2 tiny hypodensities in liver - MRI confirms these to be benign cysts  -We have provided reading materials and additional resources today including [facility] as well as [email or web address]. We discussed quality of life related issues with both ileostomy and J pouch. We discussed general expectations as well. We also discussed  scenarios where patients may ultimately be determined to have more phenotypes of Crohn's disease, pouchitis, pouch failure. Impression: -Requesting path from [facility] for 2nd pathologist to review regarding his IBD diagnosis -We discussed the anatomy and physiology of the GI tract as well as pathophysiology of IBD and colon cancer. We discussed that he does have ulcerative colitis in a now diagnosed adenocarcinoma of the colon, given his young age, recommendations for a proctocolectomy. We discussed options following this for reconstruction including a permanent end ileostomy versus the potential ileal J-pouch with diverting loop ileostomy.  -He is interested in pursuing restorative proctocolectomy with ileal j pouch, diverting loop ileostomy -The planned procedures, material risks (including, but not limited to, pain, bleeding, infection, scarring, need for blood transfusion, damage to surrounding structures- blood vessels/nerves/viscus/organs, damage to ureter, urine leak, leak from anastomosis, need for additional procedures, erectile dysfunction and/or retrograde ejaculation, worsening of pre-existing medical conditions, need for stoma which may be permanent, pouch failure, inability for pouch to reach rending what could be permeant ileostomy, hernia, recurrence, DVT/PE, pneumonia, heart attack, stroke, death) benefits and alternatives to surgery were discussed at length. The patient's and his wife's questions were answered to their satisfaction, they voiced understanding and elected to proceed with surgery. Additionally, we discussed typical postoperative expectations and the recovery process.  Sharon Mt. Dema Severin, M.D. [facility] Use [email or web address] to contact on call provider

## 2020-07-24 NOTE — Anesthesia Procedure Notes (Signed)
Procedure Name: Intubation Date/Time: 07/24/2020 7:48 AM Performed by: Keone Kamer D, CRNA Pre-anesthesia Checklist: Patient identified, Emergency Drugs available, Suction available and Patient being monitored Patient Re-evaluated:Patient Re-evaluated prior to induction Oxygen Delivery Method: Circle system utilized Preoxygenation: Pre-oxygenation with 100% oxygen Induction Type: IV induction Ventilation: Mask ventilation without difficulty Laryngoscope Size: Mac and 4 Grade View: Grade I Tube type: Oral Tube size: 7.5 mm Number of attempts: 1 Airway Equipment and Method: Stylet Placement Confirmation: ETT inserted through vocal cords under direct vision,  positive ETCO2 and breath sounds checked- equal and bilateral Secured at: 23 cm Tube secured with: Tape Dental Injury: Teeth and Oropharynx as per pre-operative assessment

## 2020-07-24 NOTE — Consult Note (Signed)
WOC consulted for preoperative marking however order placed at 0542 am when no WOC nurse available.  Patient in the OR already for 0730 case when [place] reviewed orders.    Don Meyer, Don Meyer, Don Meyer

## 2020-07-25 ENCOUNTER — Encounter (HOSPITAL_COMMUNITY): Payer: Self-pay | Admitting: Surgery

## 2020-07-25 ENCOUNTER — Other Ambulatory Visit: Payer: Self-pay

## 2020-07-25 LAB — BASIC METABOLIC PANEL
Anion gap: 11 (ref 5–15)
BUN: 14 mg/dL (ref 6–20)
CO2: 23 mmol/L (ref 22–32)
Calcium: 8.6 mg/dL — ABNORMAL LOW (ref 8.9–10.3)
Chloride: 103 mmol/L (ref 98–111)
Creatinine, Ser: 0.93 mg/dL (ref 0.61–1.24)
GFR, Estimated: >60 mL/min (ref >60)
Glucose, Bld: 133 mg/dL — ABNORMAL HIGH (ref 70–99)
Potassium: 4.2 mmol/L (ref 3.5–5.1)
Sodium: 137 mmol/L (ref 135–145)

## 2020-07-25 LAB — CBC
HCT: 41.3 % (ref 39.0–52.0)
Hemoglobin: 13.6 g/dL (ref 13.0–17.0)
MCH: 30.5 pg (ref 26.0–34.0)
MCHC: 32.9 g/dL (ref 30.0–36.0)
MCV: 92.6 fL (ref 80.0–100.0)
Platelets: 275 10*3/uL (ref 150–400)
RBC: 4.46 MIL/uL (ref 4.22–5.81)
RDW: 13.6 % (ref 11.5–15.5)
WBC: 17.5 10*3/uL — ABNORMAL HIGH (ref 4.0–10.5)
nRBC: 0 % (ref 0.0–0.2)

## 2020-07-25 MED ORDER — OXYCODONE HCL 5 MG PO TABS: 5.0000 mg | ORAL_TABLET | Freq: Four times a day (QID) | ORAL | Status: DC | PRN

## 2020-07-25 NOTE — Progress Notes (Signed)
    1 Day Post-Op  Subjective: Pain controlled. Afebrile, vitals stable, labs unremarkable. Ostomy productive of gas and a small amount of stool.   Objective: Vital signs in last 24 hours: Temp:  [98 F (36.7 C)-99.5 F (37.5 C)] 98.6 F (37 C) (04/23 0453) Pulse Rate:  [97-114] 97 (04/23 0453) Resp:  [14-19] 19 (04/23 0453) BP: (125-151)/(78-99) 150/99 (04/23 0453) SpO2:  [90 %-98 %] 92 % (04/23 0453) FiO2 (%):  [8 %] 8 % (04/22 1527) Last BM Date: 07/25/20  Intake/Output from previous day: 04/22 0701 - 04/23 0700 In: 5351.4 [P.O.:1260; I.V.:3641.4; IV Piggyback:450] Out: 0623 [Urine:2490; Drains:350; Blood:200] Intake/Output this shift: Total I/O In: 350 [P.O.:350] Out: 700 [Urine:600; Drains:100]  PE: General: resting comfortably, NAD Neuro: alert and oriented, no focal deficits Resp: normal work of breathing Abdomen: soft, nondistended, appropriately tender. Incisions clean and dry, honeycomb dressing in place. Ileostomy pink and productive of small amount of dark green stool. Extremities: warm and well-perfused GU: foley in place, draining clear yellow urine   Lab Results:  Recent Labs    07/25/20 0350  WBC 17.5*  HGB 13.6  HCT 41.3  PLT 275   BMET Recent Labs    07/25/20 0350  NA 137  K 4.2  CL 103  CO2 23  GLUCOSE 133*  BUN 14  CREATININE 0.93  CALCIUM 8.6*   PT/INR No results for input(s): LABPROT, INR in the last 72 hours. CMP     Component Value Date/Time   NA 137 07/25/2020 0350   NA 144 04/06/2020 1635   K 4.2 07/25/2020 0350   CL 103 07/25/2020 0350   CO2 23 07/25/2020 0350   GLUCOSE 133 (H) 07/25/2020 0350   BUN 14 07/25/2020 0350   BUN 11 04/06/2020 1635   CREATININE 0.93 07/25/2020 0350   CALCIUM 8.6 (L) 07/25/2020 0350   PROT 7.3 07/13/2020 1004   ALBUMIN 4.1 07/13/2020 1004   AST 25 07/13/2020 1004   ALT 44 07/13/2020 1004   ALKPHOS 54 07/13/2020 1004   BILITOT 0.9 07/13/2020 1004   GFRNONAA >60 07/25/2020 0350    GFRAA 119 04/06/2020 1635   Lipase  No results found for: LIPASE      Assessment/Plan 45 yo male with UC and adenocarcinoma, POD1 s/p robotic proctocolectomy with ileal J pouch and diverting loop ileostomy. - Full liquid diet - SLIV when tolerating PO - Keep foley until POD2 - Ambulate TID - Multimodal pain control - VTE: SQH, SCDs   LOS: 1 day    Michaelle Birks, MD [facility] Surgery General, Hepatobiliary and Pancreatic Surgery 07/25/20 10:47 AM

## 2020-07-25 NOTE — Evaluation (Signed)
Occupational Therapy Evaluation Patient Details Name: Don Meyer MRN: [id number] DOB: 29-Nov-1975 Today's Date: 07/25/2020    History of Present Illness Patient is a 45 year Meyer man s/p Robotic-assisted restorative proctocolectomy with ileal J pouch.   Clinical Impression   [name] is a 45 year Meyer man who presents s/p surgery. He is standing in his room when therapist entered and reports ambulating in the hall with nursing. Patient requiring min assist for LB ADLs due to pain and discomfort at surgery site but otherwise independence. Patient's will be assisting him at home. Therapist recommended slip on shoes, lose clothing and a long handled reacher to limit bending over at home. Patient verbalized understanding. No further OT needs.    Follow Up Recommendations  No OT follow up    Equipment Recommendations  None recommended by OT    Recommendations for Other Services       Precautions / Restrictions Precautions Precaution Comments: colostomy, r side drain Restrictions Weight Bearing Restrictions: No      Mobility Bed Mobility               General bed mobility comments: OOB an standing when therapist entered room. Repors pain with bed mobility but able to perform with bed rails.    Transfers Overall transfer level: Modified independent               General transfer comment: ambulating in hall with nursing without device.    Balance Overall balance assessment: Independent                                         ADL either performed or assessed with clinical judgement   ADL Overall ADL's : Needs assistance/impaired Eating/Feeding: Independent   Grooming: Independent   Upper Body Bathing: Independent   Lower Body Bathing: Minimal assistance   Upper Body Dressing : Independent   Lower Body Dressing: Minimal assistance   Toilet Transfer: Independent   Toileting- Clothing Manipulation and Hygiene: Independent        Functional mobility during ADLs: Independent       Vision Patient Visual Report: No change from baseline       Perception     Praxis      Pertinent Vitals/Pain Pain Assessment: Faces Faces Pain Scale: Hurts a little bit Pain Location: abdomen Pain Intervention(s): Monitored during session     Hand Dominance     Extremity/Trunk Assessment Upper Extremity Assessment Upper Extremity Assessment: Overall WFL for tasks assessed   Lower Extremity Assessment Lower Extremity Assessment: Overall WFL for tasks assessed   Cervical / Trunk Assessment Cervical / Trunk Assessment: Normal   Communication Communication Communication: No difficulties   Cognition Arousal/Alertness: Awake/alert Behavior During Therapy: WFL for tasks assessed/performed Overall Cognitive Status: Within Functional Limits for tasks assessed                                     General Comments       Exercises     Shoulder Instructions      Home Living Family/patient expects to be discharged to:: Private residence Living Arrangements: Spouse/significant other Available Help at Discharge: Family;Available 24 hours/day Type of Home: House Home Access: Level entry     Home Layout: One level     Bathroom Shower/Tub: Walk-in shower  Bathroom Toilet: Standard     Home Equipment: None          Prior Functioning/Environment Level of Independence: Independent                 OT Problem List: Pain      OT Treatment/Interventions:      OT Goals(Current goals can be found in the care plan section) Acute Rehab OT Goals OT Goal Formulation: All assessment and education complete, DC therapy  OT Frequency:     Barriers to D/C:            Co-evaluation              AM-PAC OT "6 Clicks" Daily Activity     Outcome Measure Help from another person eating meals?: None Help from another person taking care of personal grooming?: None Help from another person  toileting, which includes using toliet, bedpan, or urinal?: None Help from another person bathing (including washing, rinsing, drying)?: A Little Help from another person to put on and taking off regular upper body clothing?: None Help from another person to put on and taking off regular lower body clothing?: A Little 6 Click Score: 22   End of Session Nurse Communication:  (No OT needs)  Activity Tolerance: Patient tolerated treatment well Patient left:    OT Visit Diagnosis: Pain                Time: 8676-1950 OT Time Calculation (min): 7 min Charges:  OT General Charges $OT Visit: 1 Visit OT Evaluation $OT Eval Low Complexity: 1 Low  Tytus Strahle, OTR/L [place]  Office [phone] Pager: [place] 07/25/2020, 9:47 AM

## 2020-07-25 NOTE — Progress Notes (Signed)
PT Cancellation Note  Patient Details Name: Don Meyer MRN: [id number] DOB: [birth year]   Cancelled Treatment:     PT order received but eval deferred.  RN advises, pt is independent in room and has been ambulating in halls unassisted this am.  NO PT needs at this time and PT service will sign off.   [name] 07/25/2020, 11:57 AM

## 2020-07-26 LAB — BASIC METABOLIC PANEL
Anion gap: 11 (ref 5–15)
BUN: 13 mg/dL (ref 6–20)
CO2: 25 mmol/L (ref 22–32)
Calcium: 9.1 mg/dL (ref 8.9–10.3)
Chloride: 104 mmol/L (ref 98–111)
Creatinine, Ser: 0.87 mg/dL (ref 0.61–1.24)
GFR, Estimated: >60 mL/min (ref >60)
Glucose, Bld: 99 mg/dL (ref 70–99)
Potassium: 4 mmol/L (ref 3.5–5.1)
Sodium: 140 mmol/L (ref 135–145)

## 2020-07-26 LAB — CBC
HCT: 41 % (ref 39.0–52.0)
Hemoglobin: 13.6 g/dL (ref 13.0–17.0)
MCH: 30.5 pg (ref 26.0–34.0)
MCHC: 33.2 g/dL (ref 30.0–36.0)
MCV: 91.9 fL (ref 80.0–100.0)
Platelets: 257 10*3/uL (ref 150–400)
RBC: 4.46 MIL/uL (ref 4.22–5.81)
RDW: 13.7 % (ref 11.5–15.5)
WBC: 14.5 10*3/uL — ABNORMAL HIGH (ref 4.0–10.5)
nRBC: 0 % (ref 0.0–0.2)

## 2020-07-26 NOTE — Progress Notes (Signed)
    2 Days Post-Op  Subjective: Afebrile, vitals stable. Pain controlled. Ambulating without difficulty. Ostomy functioning. Patient is having frequent hiccups but denies nausea/vomiting and is tolerating full liquids. WBC downtrending.   Objective: Vital signs in last 24 hours: Temp:  [98.5 F (36.9 C)-98.9 F (37.2 C)] 98.9 F (37.2 C) (04/24 0642) Pulse Rate:  [57-79] 63 (04/24 0642) Resp:  [16-18] 16 (04/24 0642) BP: (145-160)/(90-99) 145/97 (04/24 0642) SpO2:  [96 %-100 %] 96 % (04/24 0642) Last BM Date: 07/25/20  Intake/Output from previous day: 04/23 0701 - 04/24 0700 In: 3230.8 [P.O.:2120; I.V.:1110.8] Out: 8010 [AQVOH:2091; Drains:510; ZCKIC:1798] Intake/Output this shift: No intake/output data recorded.  PE: General: resting comfortably, NAD Neuro: alert and oriented, no focal deficits Resp: normal work of breathing Abdomen: soft, nondistended, appropriately tender. Incisions clean and dry, honeycomb dressing in place. Ileostomy pink and productive of dark green liquid stool. JP with serosanguinous drainage. Extremities: warm and well-perfused GU: foley in place, draining clear yellow urine   Lab Results:  Recent Labs    07/25/20 0350 07/26/20 0454  WBC 17.5* 14.5*  HGB 13.6 13.6  HCT 41.3 41.0  PLT 275 257   BMET Recent Labs    07/25/20 0350 07/26/20 0454  NA 137 140  K 4.2 4.0  CL 103 104  CO2 23 25  GLUCOSE 133* 99  BUN 14 13  CREATININE 0.93 0.87  CALCIUM 8.6* 9.1   PT/INR No results for input(s): LABPROT, INR in the last 72 hours. CMP     Component Value Date/Time   NA 140 07/26/2020 0454   NA 144 04/06/2020 1635   K 4.0 07/26/2020 0454   CL 104 07/26/2020 0454   CO2 25 07/26/2020 0454   GLUCOSE 99 07/26/2020 0454   BUN 13 07/26/2020 0454   BUN 11 04/06/2020 1635   CREATININE 0.87 07/26/2020 0454   CALCIUM 9.1 07/26/2020 0454   PROT 7.3 07/13/2020 1004   ALBUMIN 4.1 07/13/2020 1004   AST 25 07/13/2020 1004   ALT 44 07/13/2020  1004   ALKPHOS 54 07/13/2020 1004   BILITOT 0.9 07/13/2020 1004   GFRNONAA >60 07/26/2020 0454   GFRAA 119 04/06/2020 1635   Lipase  No results found for: LIPASE      Assessment/Plan 45 yo male with UC and adenocarcinoma, POD2 s/p robotic proctocolectomy with ileal J pouch and diverting loop ileostomy. - Advance to soft diet, SLIV - Remove foley catheter - Ambulate, IS, pulmonary toilet - Multimodal pain control - VTE: SQH, SCDs   LOS: 2 days    Michaelle Birks, MD [facility] Surgery General, Hepatobiliary and Pancreatic Surgery 07/26/20 7:56 AM

## 2020-07-26 NOTE — Progress Notes (Signed)
Pharmacy Brief Note - Alvimopan (Entereg)  The standing order set for alvimopan (Entereg) now includes an automatic order to discontinue the drug after the patient has had a bowel movement. The change was approved by the [place] and the Medical Executive Committee.   This patient has had bowel movements/ostomy output documented by nursing. Therefore, alvimopan has been discontinued. If there are questions, please contact the pharmacy at [phone].   Thank you-  Peggyann Juba, PharmD, BCPS 07/26/2020 9:50 AM

## 2020-07-27 LAB — BASIC METABOLIC PANEL
Anion gap: 10 (ref 5–15)
BUN: 14 mg/dL (ref 6–20)
CO2: 22 mmol/L (ref 22–32)
Calcium: 9.2 mg/dL (ref 8.9–10.3)
Chloride: 104 mmol/L (ref 98–111)
Creatinine, Ser: 0.96 mg/dL (ref 0.61–1.24)
GFR, Estimated: >60 mL/min (ref >60)
Glucose, Bld: 90 mg/dL (ref 70–99)
Potassium: 4 mmol/L (ref 3.5–5.1)
Sodium: 136 mmol/L (ref 135–145)

## 2020-07-27 LAB — CBC
HCT: 45.1 % (ref 39.0–52.0)
Hemoglobin: 15.3 g/dL (ref 13.0–17.0)
MCH: 30.4 pg (ref 26.0–34.0)
MCHC: 33.9 g/dL (ref 30.0–36.0)
MCV: 89.7 fL (ref 80.0–100.0)
Platelets: 279 10*3/uL (ref 150–400)
RBC: 5.03 MIL/uL (ref 4.22–5.81)
RDW: 13.4 % (ref 11.5–15.5)
WBC: 11.5 10*3/uL — ABNORMAL HIGH (ref 4.0–10.5)
nRBC: 0 % (ref 0.0–0.2)

## 2020-07-27 MED ORDER — PSYLLIUM 95 % PO PACK
1.0000 | PACK | Freq: Two times a day (BID) | ORAL | Status: DC
  Administered 2020-07-27 – 2020-07-29 (×4): 1 via ORAL
  Filled 2020-07-27 (×5): qty 1

## 2020-07-27 NOTE — TOC Initial Note (Signed)
Transition of Care [facility]) - Initial/Assessment Note    Patient Details  Name: Don Meyer MRN: [id number] Date of Birth: [birth year]  [facility] CM/SW Contact:    Lennart Pall, LCSW Phone Number: 07/27/2020, 4:06 PM  Clinical Narrative:                 Met with pt to introduce self/ role.  Pt very pleasant and reports good support at home from his wife and children.  He is agreeable to MD orders for [facility] and has no agency preference.  Pt anticipating dc Wednesday.  Have begun search for [facility] agency coverage and hope to have secured by tomorrow.    Expected Discharge Plan: [place] Barriers to Discharge: Continued Medical Work up   Patient Goals and CMS Choice Patient states their goals for this hospitalization and ongoing recovery are:: return home      Expected Discharge Plan and Services Expected Discharge Plan: [place] In-house Referral: Clinical Social Work     Living arrangements for the past 2 months: Single Family Home                                      Prior Living Arrangements/Services Living arrangements for the past 2 months: Single Family Home Lives with:: Minor Children,Spouse Patient language and need for interpreter reviewed:: Yes Do you feel safe going back to the place where you live?: Yes      Need for Family Participation in Patient Care: Yes (Comment) Care giver support system in place?: Yes (comment)   Criminal Activity/Legal Involvement Pertinent to Current Situation/Hospitalization: No - Comment as needed  Activities of Daily Living Home Assistive Devices/Equipment: None ADL Screening (condition at time of admission) Patient's cognitive ability adequate to safely complete daily activities?: Yes Is the patient deaf or have difficulty hearing?: No Does the patient have difficulty seeing, even when wearing glasses/contacts?: No Does the patient have difficulty concentrating, remembering, or  making decisions?: No Patient able to express need for assistance with ADLs?: Yes Does the patient have difficulty dressing or bathing?: No Independently performs ADLs?: Yes (appropriate for developmental age) Does the patient have difficulty walking or climbing stairs?: No Weakness of Legs: None Weakness of Arms/Hands: None  Permission Sought/Granted Permission sought to share information with : Family Supports Permission granted to share information with : Yes, Verbal Permission Granted  Share Information with NAME: Ebenezer Mccaskey     Permission granted to share info w Relationship: spouse  Permission granted to share info w Contact Information: [phone]  Emotional Assessment Appearance:: Appears stated age Attitude/Demeanor/Rapport: Gracious,Engaged Affect (typically observed): Accepting Orientation: : Oriented to Place,Oriented to  Time,Oriented to Situation,Oriented to Self Alcohol / Substance Use: Not Applicable Psych Involvement: No (comment)  Admission diagnosis:  S/P total colectomy [Z90.49] Patient Active Problem List   Diagnosis Date Noted  . S/P total colectomy 07/24/2020  . Essential hypertension 04/08/2020  . Abnormal EKG 01/06/2020  . Mixed hyperlipidemia 01/06/2020   PCP:  Deland Pretty, MD Pharmacy:   [id number] [place] [place] [id number]: [phone]: [phone]    Social Determinants of Health (SDOH) Interventions    Readmission Risk Interventions Readmission Risk Prevention Plan 07/27/2020  Post Dischage Appt Complete  Medication Screening Complete  Transportation Screening Complete  Some recent data might be hidden

## 2020-07-27 NOTE — Progress Notes (Signed)
Subjective No acute events. Doing quite well. Tolerating regular food without n/v. Ostomy output has thickened since starting solid food.  Objective: Vital signs in last 24 hours: Temp:  [98.4 F (36.9 C)-99 F (37.2 C)] 98.4 F (36.9 C) (04/25 0610) Pulse Rate:  [62-80] 65 (04/25 0610) Resp:  [16-18] 17 (04/25 0610) BP: (136-158)/(93-106) 136/93 (04/25 0610) SpO2:  [94 %-96 %] 94 % (04/25 0610) Weight:  [100.4 kg] 100.4 kg (04/25 0500) Last BM Date: 07/26/20  Intake/Output from previous day: 04/24 0701 - 04/25 0700 In: 240 [P.O.:240] Out: 1909 [Urine:4; Drains:330; QHUTM:5465] Intake/Output this shift: No intake/output data recorded.  Gen: NAD, comfortable CV: RRR Pulm: Normal work of breathing Abd: Soft, NT/ND; incisions c/d/i without erythema or drainage. Ostomy productive. Stoma rod in place. JP serosang thin Ext: SCDs in place  Lab Results: CBC  Recent Labs    07/26/20 0454 07/27/20 0452  WBC 14.5* 11.5*  HGB 13.6 15.3  HCT 41.0 45.1  PLT 257 279   BMET Recent Labs    07/26/20 0454 07/27/20 0452  NA 140 136  K 4.0 4.0  CL 104 104  CO2 25 22  GLUCOSE 99 90  BUN 13 14  CREATININE 0.87 0.96  CALCIUM 9.1 9.2   PT/INR No results for input(s): LABPROT, INR in the last 72 hours. ABG No results for input(s): PHART, HCO3 in the last 72 hours.  Invalid input(s): PCO2, PO2  Studies/Results:  Anti-infectives: Anti-infectives (From admission, onward)   Start     Dose/Rate Route Frequency Ordered Stop   07/24/20 1400  neomycin (MYCIFRADIN) tablet 1,000 mg  Status:  Discontinued       "And" Linked Group Details   1,000 mg Oral 3 times per day 07/24/20 0542 07/24/20 0544   07/24/20 1400  metroNIDAZOLE (FLAGYL) tablet 1,000 mg  Status:  Discontinued       "And" Linked Group Details   1,000 mg Oral 3 times per day 07/24/20 0542 07/24/20 0544   07/24/20 1355  sodium chloride 0.9 % with cefoTEtan (CEFOTAN) ADS Med       Note to Pharmacy: Bridget Hartshorn   :  cabinet override      07/24/20 1355 07/24/20 1401   07/24/20 0600  cefoTEtan (CEFOTAN) 2 g in sodium chloride 0.9 % 100 mL IVPB        2 g 200 mL/hr over 30 Minutes Intravenous On call to O.R. 07/24/20 0542 07/24/20 1357       Assessment/Plan: Patient Active Problem List   Diagnosis Date Noted  . S/P total colectomy 07/24/2020  . Essential hypertension 04/08/2020  . Abnormal EKG 01/06/2020  . Mixed hyperlipidemia 01/06/2020   s/p Procedure(s): ROBOTIC RESTOREATIVE PROCTOCOLECTOMY AND BILATERAL TAP BLOCK ILEAL J POUCH ANAL ANASTAMOSIS WITH POUCHOSCOPY DIVERTING LOOP ILEOSTOMY 07/24/2020  -Spent time reviewing again procedure, findings and plans -Soft diet as tolerated -Start metamucil to help thicken output and decrease volume -WOCN for new ileostomy - hopefully will be enrolled in outpatient ostomy clinic -Home health for new ileostomy  -PPx: SQH, SCDs -Dispo: Pending working with stoma team, comfortable with ostomy care, output <1.2L / 24 hrs   LOS: 3 days   Nadeen Landau, MD [facility] Use [email or web address] to contact on call provider

## 2020-07-27 NOTE — Consult Note (Signed)
[place] Nurse ostomy consult note Stoma type/location: mid abdomen Stomal assessment/size: 13/8" by 1 5/8" oval shaped; os at skin level at skin level (6oclock), red robinson cath in place for loop stoma Peristomal assessment: intact; stoma with tension Treatment options for stomal/peristomal skin: 1 whole barrier ring used today with 1/2" of barrier ring used from 3-9 o'clock  Output seedy; liquid brown stool Ostomy pouching: 2pc. 2 3/4" used today will barrier rings. Needs soft convex most likely with belt; supplies ordered Education provided:  Explained role of ostomy nurse and creation of stoma  Explained stoma characteristics (budded, flush, color, texture, care) Demonstrated pouch change (cutting new barrier, measuring stoma, cleaning peristomal skin and stoma, use of barrier ring) Patient has been empyting his pouch over the weekend. Enrolled patient in [place] program: Yes  [place] Nurse will follow along with you for continued support with ostomy teaching and care [place] MSN, [place], [place], [place], [place]

## 2020-07-28 MED ORDER — LOPERAMIDE HCL 2 MG PO CAPS
2.0000 mg | ORAL_CAPSULE | Freq: Two times a day (BID) | ORAL | Status: DC
  Administered 2020-07-28 – 2020-07-29 (×3): 2 mg via ORAL
  Filled 2020-07-28 (×3): qty 1

## 2020-07-28 MED ORDER — TRAMADOL HCL 50 MG PO TABS: 50.0000 mg | ORAL_TABLET | Freq: Four times a day (QID) | ORAL | 0 refills | Status: AC | PRN

## 2020-07-28 NOTE — Progress Notes (Addendum)
Subjective No acute events. Doing quite well. Tolerating regular food without n/v. Ambulating well on his own. Emptying and beginning to change appliance independently.  Objective: Vital signs in last 24 hours: Temp:  [97.8 F (36.6 C)-98.4 F (36.9 C)] 97.8 F (36.6 C) (04/26 0543) Pulse Rate:  [92-109] 92 (04/26 0543) Resp:  [16-18] 18 (04/26 0543) BP: (97-145)/(65-87) 97/65 (04/26 0543) SpO2:  [96 %-98 %] 96 % (04/26 0543) Last BM Date: 07/27/20  Intake/Output from previous day: 04/25 0701 - 04/26 0700 In: 1320 [P.O.:1320] Out: 2153 [Urine:3; Drains:150; Stool:2000] Intake/Output this shift: No intake/output data recorded.  Gen: NAD, comfortable CV: RRR Pulm: Normal work of breathing Abd: Soft, NT/ND; incisions c/d/i without erythema or drainage. Ostomy productive. Stoma rod in place. JP serosang thin Ext: SCDs in place  Lab Results: CBC  Recent Labs    07/26/20 0454 07/27/20 0452  WBC 14.5* 11.5*  HGB 13.6 15.3  HCT 41.0 45.1  PLT 257 279   BMET Recent Labs    07/26/20 0454 07/27/20 0452  NA 140 136  K 4.0 4.0  CL 104 104  CO2 25 22  GLUCOSE 99 90  BUN 13 14  CREATININE 0.87 0.96  CALCIUM 9.1 9.2   PT/INR No results for input(s): LABPROT, INR in the last 72 hours. ABG No results for input(s): PHART, HCO3 in the last 72 hours.  Invalid input(s): PCO2, PO2  Studies/Results:  Anti-infectives: Anti-infectives (From admission, onward)   Start     Dose/Rate Route Frequency Ordered Stop   07/24/20 1400  neomycin (MYCIFRADIN) tablet 1,000 mg  Status:  Discontinued       "And" Linked Group Details   1,000 mg Oral 3 times per day 07/24/20 0542 07/24/20 0544   07/24/20 1400  metroNIDAZOLE (FLAGYL) tablet 1,000 mg  Status:  Discontinued       "And" Linked Group Details   1,000 mg Oral 3 times per day 07/24/20 0542 07/24/20 0544   07/24/20 1355  sodium chloride 0.9 % with cefoTEtan (CEFOTAN) ADS Med       Note to Pharmacy: Bridget Hartshorn   : cabinet  override      07/24/20 1355 07/24/20 1401   07/24/20 0600  cefoTEtan (CEFOTAN) 2 g in sodium chloride 0.9 % 100 mL IVPB        2 g 200 mL/hr over 30 Minutes Intravenous On call to O.R. 07/24/20 0542 07/24/20 1357       Assessment/Plan: Patient Active Problem List   Diagnosis Date Noted  . S/P total colectomy 07/24/2020  . Essential hypertension 04/08/2020  . Abnormal EKG 01/06/2020  . Mixed hyperlipidemia 01/06/2020   s/p Procedure(s): ROBOTIC RESTOREATIVE PROCTOCOLECTOMY AND BILATERAL TAP BLOCK ILEAL J POUCH ANAL ANASTAMOSIS WITH POUCHOSCOPY DIVERTING LOOP ILEOSTOMY 07/24/2020  -Spent time reviewing again procedure, findings and plans -Soft diet as tolerated - avoid concentrated sweets if possible -Ileostomy 2L out - cont metamucil; adding imodium today, starting low with 2 mg BID -WOCN for new ileostomy -Referral to outpt ostomy clinic in place -Home health for new ileostomy  -PPx: SQH, SCDs -Dispo: Pending working with stoma team, comfortable with ostomy care, output <1.2L / 24 hrs  Pathology showed T1N0 invasive adenocarcinoma of the colon, mild active colitis. Overall, stage 1 colon cancer in background of his UC. We reviewed these findings, answered his questions and gave an overview of surveillance moving forward.   LOS: 4 days   Nadeen Landau, MD [facility] Use [email or web address] to contact on  call provider

## 2020-07-28 NOTE — Progress Notes (Signed)
Red tubing in ostomy removed per MD request.

## 2020-07-29 LAB — SURGICAL PATHOLOGY

## 2020-07-29 MED ORDER — LOPERAMIDE HCL 2 MG PO CAPS: 2.0000 mg | ORAL_CAPSULE | Freq: Two times a day (BID) | ORAL | 2 refills | Status: DC

## 2020-07-29 NOTE — Progress Notes (Signed)
Patient was given discharge instructions, and all questions were answered. Patient was stable for discharge and was walked to the main exit. 

## 2020-07-29 NOTE — Progress Notes (Signed)
Subjective No acute events. Feeling well. No nausea or vomiting. Ostomy productive, output thickening nicely. No abdominal pain  Objective: Vital signs in last 24 hours: Temp:  [98.2 F (36.8 C)-98.9 F (37.2 C)] 98.2 F (36.8 C) (04/27 0623) Pulse Rate:  [62-109] 62 (04/27 0623) Resp:  [16-18] 18 (04/27 0623) BP: (97-115)/(61-77) 103/67 (04/27 0623) SpO2:  [92 %-96 %] 94 % (04/27 0623) Last BM Date: 07/28/20  Intake/Output from previous day: 04/26 0701 - 04/27 0700 In: 1680 [P.O.:1680] Out: 1760 [Drains:760; Stool:1000] Intake/Output this shift: Total I/O In: -  Out: 50 [Stool:50]  Gen: NAD, comfortable CV: RRR Pulm: Normal work of breathing Abd: Soft, NT/ND. Incisions c/d/i without erythema or drainage. JP ss. Ostomy pink, productive. Ext: SCDs in place  Lab Results: CBC  Recent Labs    07/27/20 0452  WBC 11.5*  HGB 15.3  HCT 45.1  PLT 279   BMET Recent Labs    07/27/20 0452  NA 136  K 4.0  CL 104  CO2 22  GLUCOSE 90  BUN 14  CREATININE 0.96  CALCIUM 9.2   PT/INR No results for input(s): LABPROT, INR in the last 72 hours. ABG No results for input(s): PHART, HCO3 in the last 72 hours.  Invalid input(s): PCO2, PO2  Studies/Results:  Anti-infectives: Anti-infectives (From admission, onward)   Start     Dose/Rate Route Frequency Ordered Stop   07/24/20 1400  neomycin (MYCIFRADIN) tablet 1,000 mg  Status:  Discontinued       "And" Linked Group Details   1,000 mg Oral 3 times per day 07/24/20 0542 07/24/20 0544   07/24/20 1400  metroNIDAZOLE (FLAGYL) tablet 1,000 mg  Status:  Discontinued       "And" Linked Group Details   1,000 mg Oral 3 times per day 07/24/20 0542 07/24/20 0544   07/24/20 1355  sodium chloride 0.9 % with cefoTEtan (CEFOTAN) ADS Med       Note to Pharmacy: Bridget Hartshorn   : cabinet override      07/24/20 1355 07/24/20 1401   07/24/20 0600  cefoTEtan (CEFOTAN) 2 g in sodium chloride 0.9 % 100 mL IVPB        2 g 200 mL/hr over  30 Minutes Intravenous On call to O.R. 07/24/20 0542 07/24/20 1357       Assessment/Plan: Patient Active Problem List   Diagnosis Date Noted  . S/P total colectomy 07/24/2020  . Essential hypertension 04/08/2020  . Abnormal EKG 01/06/2020  . Mixed hyperlipidemia 01/06/2020   s/p Procedure(s): ROBOTIC RESTOREATIVE PROCTOCOLECTOMY AND BILATERAL TAP BLOCK ILEAL J POUCH ANAL ANASTAMOSIS WITH POUCHOSCOPY DIVERTING LOOP ILEOSTOMY 07/24/2020  -Drain teaching -Continue metamucil and imodium - we discussed importance of monitoring output and totaling 24 hr output from ostomy - if >1.2 L in 24 hrs to please let us know at that time. Avoid concentrated sweets. He has good understanding and has been able to teach things back to [place] -Home health underway. He is otherwise comfortable with and stable for discharge home. -Drain appointment Friday in office for possible removal   LOS: 5 days   Nadeen Landau, MD [facility] Use [email or web address] to contact on call provider

## 2020-07-29 NOTE — Discharge Instructions (Signed)
POST OP INSTRUCTIONS AFTER COLON SURGERY  1. DIET: Be sure to include lots of fluids daily to stay hydrated - 64oz of water per day (8, 8 oz glasses).  Avoid fast food or heavy meals for the first couple of weeks as your are more likely to get nauseated. Avoid raw/uncooked fruits or vegetables for the first 4 weeks (its ok to have these if they are blended into smoothie form). If you have fruits/vegetables, make sure they are cooked until soft enough to mash on the roof of your mouth and chew your food well. Otherwise, diet as tolerated.  2. Take your usually prescribed home medications unless otherwise directed.  3. PAIN CONTROL: a. Pain is best controlled by a usual combination of three different methods TOGETHER: i. Ice/Heat ii. Over the counter pain medication iii. Prescription pain medication b. Most patients will experience some swelling and bruising around the surgical site.  Ice packs or heating pads (30-60 minutes up to 6 times a day) will help. Some people prefer to use ice alone, heat alone, alternating between ice & heat.  Experiment to what works for you.  Swelling and bruising can take several weeks to resolve.   c. It is helpful to take an over-the-counter pain medication regularly for the first few weeks: i. Ibuprofen (Motrin/Advil) - 200mg  tabs - take 3 tabs (600mg ) every 6 hours as needed for pain (unless you have been directed previously to avoid NSAIDs/ibuprofen) ii. Acetaminophen (Tylenol) - you may take 650mg  every 6 hours as needed. You can take this with motrin as they act differently on the body. If you are taking a narcotic pain medication that has acetaminophen in it, do not take over the counter tylenol at the same time. iii. NOTE: You may take both of these medications together - most patients  find it most helpful when alternating between the two (i.e. Ibuprofen at 6am, tylenol at 9am, ibuprofen at 12pm ...) d. A  prescription for pain medication should be given to you  upon discharge.  Take your pain medication as prescribed if your pain is not adequatly controlled with the over-the-counter pain reliefs mentioned above.  4. Ileostomy: Care as reviewed in the hospital with the wound ostomy nursing team. A referral to the outpatient ostomy clinic has also been made. Supplies have been ordered to be mailed to your house. Monitor the volume and record. Total the 24 hr output in milliliters. If it is greater than 1200 mL (1.2L) in 24 hrs, please let us know so we can make changes. Continue twice daily metamucil and additionally the imodium (2mg  twice daily). 5. JP Drain: Empty and record volume twice daily. Bring this to your drain check appointment scheduled in our office for 07/31/2020.  6. Dressing: Your incisions are covered in Dermabond which is like sterile superglue for the skin. This will come off on it's own in a couple weeks. It is waterproof and you may bathe normally starting the day after your surgery in a shower. Avoid baths/pools/lakes/oceans until your wounds have fully healed.  7. ACTIVITIES as tolerated:   a. Avoid heavy lifting (>10lbs or 1 gallon of milk) for the next 6 weeks. b. You may resume regular daily activities as tolerated--such as daily self-care, walking, climbing stairs--gradually increasing activities as tolerated.  If you can walk 30 minutes without difficulty, it is safe to try more intense activity such as jogging, treadmill, bicycling, low-impact aerobics.  c. DO NOT PUSH THROUGH PAIN.  Let pain be your guide:  If it hurts to do something, don't do it. d. Dennis Bast may drive when you are no longer taking prescription pain medication, you can comfortably wear a seatbelt, and you can safely maneuver your car and apply brakes.  8. FOLLOW UP in our office a. Please call [facility] at [phone] to set up an appointment to see your surgeon in the office for a follow-up appointment approximately 2 weeks after your surgery. b. Make sure that you call  for this appointment the day you arrive home to insure a convenient appointment time.  9. If you have disability or family leave forms that need to be completed, you may have them completed by your primary care physician's office; for return to work instructions, please ask our office staff and they will be happy to assist you in obtaining this documentation   When to call us [phone]: 1. Poor pain control 2. Reactions / problems with new medications (rash/itching, etc)  3. Fever over 101.5 F (38.5 C) 4. Inability to urinate 5. Nausea/vomiting 6. Worsening swelling or bruising 7. Continued bleeding from incision. 8. Increased pain, redness, or drainage from the incision  The clinic staff is available to answer your questions during regular business hours (8:30am-5pm).  Please don't hesitate to call and ask to speak to one of our nurses for clinical concerns.   A surgeon from [facility] is always on call at the hospitals   If you have a medical emergency, go to the nearest emergency room or call 911.  [facility], [place] [address] [place], [place], [place]  [address] MAIN: [phone] FAX: [phone] [email or web address]

## 2020-07-29 NOTE — Consult Note (Addendum)
[place] Nurse ostomy follow-up consult note Stoma type/location: Ileostomy Stomal assessment/size: 1 5/8" oval shaped; os at skin level at skin level and points downward at 6:00 o'clock.   Red rubber rod was removed yesterday by the staff nurses, according to progress notes Current pouch was leaking behind the barrier.  Pt is able to open and close to empty independently.  Output: mod amt liquid brown stool Ostomy pouching: Reviewed pouching routines and ordering supplies. Discussed dietary precautions and avoiding dehydration. Pt was able to perform pouch change without assistance.  Applied barrier ring and one piece flexible convex pouch.  Demonstrated use of belt if needed after discharge.  Provided with 4 sets of barrier rings Kellie Simmering # [phone] and one piece flexible pouches, Kellie Simmering # [phone] and belt Enrolled patient in [place] program: Yes, previously Julien Girt MSN, [place], [place], [place], [place]

## 2020-07-29 NOTE — Discharge Summary (Signed)
Patient ID: Don Meyer MRN: [id number] DOB/AGE: 09-12-1975 45 y.o.  Admit date: 07/24/2020 Discharge date: 07/29/2020  Discharge Diagnoses Patient Active Problem List   Diagnosis Date Noted  . S/P total colectomy 07/24/2020  . Essential hypertension 04/08/2020  . Abnormal EKG 01/06/2020  . Mixed hyperlipidemia 01/06/2020   Procedures OR 07/24/20 PROCEDURE:  1. Robotic-assisted restorative proctocolectomy with ileal J pouch 2. Diverting loop ileostomy 3. Pouchoscopy 4. Bilateral transversus abdominus plane blocks   Hospital Course: He was admitted postoperatively where he did quite well. His diet was advanced and he began having ostomy output. Initially this was >1.2L/24hrs. He was started on BID metamucil, eliminated concentrated sweets and additionally added 2 mg Imodium BID. With this regimen, his output was toothpaste in consistency and controlled. He was instructed on ostomy management and care with our WOCN team and felt comfortable. Additionally home health was arranged to help bridge the transition. He was referred to the outpatient ostomy clinic.  He was instruced on JP drain emptying and care as well as to monitor ileostomy output. We asked he keep [place] aprised of the output and discussed importance of hydration. He was comfortable with and stable for discharge home 07/29/20.  He has been requested to return to the office 07/31/20 for drain check and possible removal  Follow-up appointment with me arranged as well.    Allergies as of 07/29/2020   No Known Allergies     Medication List    STOP taking these medications   mesalamine 1.2 g EC tablet Commonly known as: LIALDA   Suprep Bowel Prep Kit 17.5-3.13-1.6 GM/177ML Soln Generic drug: Na Sulfate-K Sulfate-Mg Sulf     TAKE these medications   loperamide 2 MG capsule Commonly known as: IMODIUM Take 1 capsule (2 mg total) by mouth 2 (two) times daily.   losartan-hydrochlorothiazide 50-12.5 MG tablet Commonly known  as: HYZAAR TAKE 1 TABLET BY MOUTH EVERY DAY   multivitamin with minerals Tabs tablet Take 1 tablet by mouth 3 (three) times a week. Centrum for Men   rosuvastatin 20 MG tablet Commonly known as: CRESTOR Take 1 tablet (20 mg total) by mouth daily. What changed: when to take this   traMADol 50 MG tablet Commonly known as: Ultram Take 1 tablet (50 mg total) by mouth every 6 (six) hours as needed for up to 5 days (pain not controlled with tylenol and ibuprofen first).         Follow-up Information    [facility] Follow up.   Why: to provide home health nursing visits Contact information:  [phone]              Sharon Mt. Dema Severin, M.D. [facility]

## 2020-07-29 NOTE — Progress Notes (Signed)
1430) Rec'ed report from PACU that pt was stable and ready to come to the floor. During report, RN stated that the pt's ileostomy was leaking, but that she had fixed it with some tegaderm (applied to the bag). Questioned about changing the ostomy bag and was told that it was not necessary, as the appliance was now not leaking. 1745) Called PACU to get an update on pt's status. Was told pt has remained stable, no change in his status, pain was controlled and that they would bring him to the floor soon. At approx 1830) pt arrived via stretcher and was walked to the recliner. Upon examination of his surgical site(s), noted 2 different pieces of tegaderm was attached to pt's ostomy, with very obvious leaking from multiple sites. Pt stated he was in severe pain -  1900)Pt assisted to get back into his bed. Pt's ileostomy bag was changed, noted no barrier ring was applied to the old, leaking ostomy appliance. Pain med given per night shift RN and pt made as comfortable as possible.

## 2020-07-29 NOTE — TOC Transition Note (Signed)
Transition of Care [facility]) - CM/SW Discharge Note   Patient Details  Name: Don Meyer MRN: [id number] Date of Birth: 1975-05-15  Transition of Care [facility] CM/SW Contact:  Lennart Pall, LCSW Phone Number: 07/29/2020, 9:44 AM   Clinical Narrative:     Pt medically cleared for dc today with [facility] services arranged via [place].  Pt aware, agreeable and ready for dc.  No further TOC needs.  Final next level of care: [place] Barriers to Discharge: Barriers Resolved   Patient Goals and CMS Choice Patient states their goals for this hospitalization and ongoing recovery are:: return home      Discharge Placement                       Discharge Plan and Services In-house Referral: Clinical Social Work                        HH Arranged: RN [place] Agency: [place] ([place]) Date [place]: 07/27/20   Representative spoke with at [place]: Ramond Marrow B.  Social Determinants of Health (SDOH) Interventions     Readmission Risk Interventions Readmission Risk Prevention Plan 07/27/2020  Post Dischage Appt Complete  Medication Screening Complete  Transportation Screening Complete  Some recent data might be hidden

## 2020-07-30 DIAGNOSIS — Z79891 Long term (current) use of opiate analgesic: Secondary | ICD-10-CM | POA: Diagnosis not present

## 2020-07-30 DIAGNOSIS — K5100 Ulcerative (chronic) pancolitis without complications: Secondary | ICD-10-CM | POA: Diagnosis not present

## 2020-07-30 DIAGNOSIS — Z432 Encounter for attention to ileostomy: Secondary | ICD-10-CM | POA: Diagnosis not present

## 2020-07-30 DIAGNOSIS — I10 Essential (primary) hypertension: Secondary | ICD-10-CM | POA: Diagnosis not present

## 2020-07-30 DIAGNOSIS — E782 Mixed hyperlipidemia: Secondary | ICD-10-CM | POA: Diagnosis not present

## 2020-07-30 DIAGNOSIS — C187 Malignant neoplasm of sigmoid colon: Secondary | ICD-10-CM | POA: Diagnosis not present

## 2020-07-30 DIAGNOSIS — Z4803 Encounter for change or removal of drains: Secondary | ICD-10-CM | POA: Diagnosis not present

## 2020-08-03 ENCOUNTER — Telehealth (HOSPITAL_COMMUNITY): Payer: Self-pay | Admitting: Nurse Practitioner

## 2020-08-05 ENCOUNTER — Other Ambulatory Visit: Payer: Self-pay

## 2020-08-05 NOTE — Progress Notes (Signed)
The proposed treatment discussed in conference is for discussion purposes only and is not a binding recommendation.  The patients have not been physically examined, or presented with their treatment options.  Therefore, final treatment plans cannot be decided.   

## 2020-08-06 DIAGNOSIS — Z932 Ileostomy status: Secondary | ICD-10-CM | POA: Diagnosis not present

## 2020-08-06 DIAGNOSIS — Z85038 Personal history of other malignant neoplasm of large intestine: Secondary | ICD-10-CM | POA: Diagnosis not present

## 2020-08-07 ENCOUNTER — Telehealth: Payer: Self-pay | Admitting: Genetic Counselor

## 2020-08-07 NOTE — Telephone Encounter (Signed)
Received an urgent genetics referral from Dr. Dema Severin at [facility] for colon cancer. Don Meyer has been cld and scheduled to see [name] on 5/11 at 9am. Pt aware to arrive 15 minutes early

## 2020-08-12 ENCOUNTER — Inpatient Hospital Stay: Payer: 59 | Attending: Genetic Counselor | Admitting: Genetic Counselor

## 2020-08-12 ENCOUNTER — Other Ambulatory Visit: Payer: Self-pay

## 2020-08-12 ENCOUNTER — Inpatient Hospital Stay: Payer: 59

## 2020-08-12 ENCOUNTER — Encounter: Payer: Self-pay | Admitting: Genetic Counselor

## 2020-08-12 DIAGNOSIS — Z8042 Family history of malignant neoplasm of prostate: Secondary | ICD-10-CM

## 2020-08-12 DIAGNOSIS — Z803 Family history of malignant neoplasm of breast: Secondary | ICD-10-CM

## 2020-08-12 DIAGNOSIS — C189 Malignant neoplasm of colon, unspecified: Secondary | ICD-10-CM | POA: Diagnosis not present

## 2020-08-12 NOTE — Progress Notes (Signed)
REFERRING PROVIDER: Ileana Roup, MD [place],  [place] [address]  PRIMARY PROVIDER:  Deland Pretty, MD  PRIMARY REASON FOR VISIT:  1. Family history of breast cancer   2. Family history of prostate cancer   3. Malignant neoplasm of colon, unspecified part of colon ([place])      HISTORY OF PRESENT ILLNESS:   Don Meyer, a 45 y.o. male, was seen for a [place] cancer genetics consultation at the request of Dr. Dema Severin due to a personal and family history of cancer.  Don Meyer presents to clinic today to discuss the possibility of a hereditary predisposition to cancer, genetic testing, and to further clarify his future cancer risks, as well as potential cancer risks for family members.   In April 2022, at the age of 33, Don Meyer was diagnosed with colon cancer. The treatment plan included a colectomy, but was stage II so he does not need further treatment.    CANCER HISTORY:  Oncology History   No history exists.     Past Medical History:  Diagnosis Date  . Colon cancer ([place])   . Family history of breast cancer   . Family history of prostate cancer   . Hyperlipidemia   . Hypertension   . Ulcerative colitis [facility])     Past Surgical History:  Procedure Laterality Date  . COLONOSCOPY    . DIVERTING ILEOSTOMY N/A 07/24/2020   Procedure: DIVERTING LOOP ILEOSTOMY;  Surgeon: Ileana Roup, MD;  Location: [facility];  Service: General;  Laterality: N/A;  . ILEAL POUCH N/A 07/24/2020   Procedure: ILEAL J POUCH ANAL ANASTAMOSIS WITH POUCHOSCOPY;  Surgeon: Ileana Roup, MD;  Location: [facility];  Service: General;  Laterality: N/A;  . WISDOM TOOTH EXTRACTION    . XI ROBOTIC ASSISTED LOWER ANTERIOR RESECTION N/A 07/24/2020   Procedure: ROBOTIC RESTOREATIVE PROCTOCOLECTOMY AND BILATERAL TAP BLOCK;  Surgeon: Ileana Roup, MD;  Location: [facility];  Service: General;  Laterality: N/A;    Social History   Socioeconomic History  . Marital status: Married     Spouse name: Not on file  . Number of children: 2  . Years of education: Not on file  . Highest education level: Not on file  Occupational History  . Not on file  Tobacco Use  . Smoking status: Never Smoker  . Smokeless tobacco: Never Used  Vaping Use  . Vaping Use: Never used  Substance and Sexual Activity  . Alcohol use: Yes    Comment: occ  . Drug use: Never  . Sexual activity: Not on file  Other Topics Concern  . Not on file  Social History Narrative  . Not on file   Social Determinants of Health   Financial Resource Strain: Not on file  Food Insecurity: Not on file  Transportation Needs: Not on file  Physical Activity: Not on file  Stress: Not on file  Social Connections: Not on file     FAMILY HISTORY:  We obtained a detailed, 4-generation family history.  Significant diagnoses are listed below: Family History  Problem Relation Age of Onset  . Heart disease Mother   . COPD Father   . Drug abuse Brother        d 38s  . Irritable bowel syndrome Maternal Uncle   . Prostate cancer Paternal Uncle        d. "young"  . Lung cancer Maternal Grandmother        mets to liver  .  Alcohol abuse Maternal Grandfather   . Cancer Paternal Grandfather        thought to be asbestos related  . Breast cancer Other        dx in her 31s; MGM's sister  . Prostate cancer Other        MGMs brother; d. in his 48s-40s    The patient has a son and daughter who are cancer free.  He had one brother who died of a drug overdose.  His father is deceased and his mother is living.  The patient's father died from complications of COPD and treatment.  He had two half brothers, one who had prostate cancer 'young' and the other had cancer NOS.  The paternal grandparents are both deceased, possibly from cancer.  The patient's mother is cancer free.  She has a maternal half brother who is living and has IBS, but no cancer.  Her mother died of lung cancer and her father from alcohol related  problems.  Her mother had a brother and sister, the sister had breast cancer in her 38's and the brother had prostate cancer in his 50's-40's.  The great grandparents both had cancer NOS, and the great grandmother's sister had breast cancer.  Don Meyer is unaware of previous family history of genetic testing for hereditary cancer risks. Patient's maternal ancestors are of [place], [place] and [place] descent, and paternal ancestors are of [place] descent. There is no reported Ashkenazi Jewish ancestry. There is no known consanguinity.  GENETIC COUNSELING ASSESSMENT: Don Meyer is a 45 y.o. male with a personal and family history of cancer which is somewhat suggestive of a hereditary cancer syndrome and predisposition to cancer given the young ages of onset of cancer in the family. We, therefore, discussed and recommended the following at today's visit.   DISCUSSION: We discussed that 5 - 7% of colon cancer is hereditary, with most cases associated with Lynch syndrome.  There are other genes that can be associated with hereditary colon cancer syndromes.  These include APC, MUTYH and others.  We discussed that testing is beneficial for several reasons including knowing how to follow individuals after completing their treatment,and understand if other family members could be at risk for cancer and allow them to undergo genetic testing.   We reviewed the characteristics, features and inheritance patterns of hereditary cancer syndromes. We also discussed genetic testing, including the appropriate family members to test, the process of testing, insurance coverage and turn-around-time for results. We discussed the implications of a negative, positive, carrier and/or variant of uncertain significant result. We recommended Don Meyer pursue genetic testing for the common hereditary cancer gene panel+RNA. The Common Hereditary Gene Panel offered by [facility] includes sequencing and/or deletion duplication testing of the  following 47 genes: APC, ATM, AXIN2, BARD1, BMPR1A, BRCA1, BRCA2, BRIP1, CDH1, CDK4, CDKN2A (p14ARF), CDKN2A (p16INK4a), CHEK2, CTNNA1, DICER1, EPCAM (Deletion/duplication testing only), GREM1 (promoter region deletion/duplication testing only), KIT, MEN1, MLH1, MSH2, MSH3, MSH6, MUTYH, NBN, NF1, NHTL1, PALB2, PDGFRA, PMS2, POLD1, POLE, PTEN, RAD50, RAD51C, RAD51D, SDHB, SDHC, SDHD, SMAD4, SMARCA4. STK11, TP53, TSC1, TSC2, and VHL.  The following genes were evaluated for sequence changes only: SDHA and HOXB13 c.251G>A variant only.   Based on Don Meyer personal and family history of cancer, he meets medical criteria for genetic testing. Despite that he meets criteria, he may still have an out of pocket cost. We discussed that if his out of pocket cost for testing is over $100, the laboratory will call and confirm  whether he wants to proceed with testing.  If the out of pocket cost of testing is less than $100 he will be billed by the genetic testing laboratory.   PLAN: After considering the risks, benefits, and limitations, Don Meyer provided informed consent to pursue genetic testing and the blood sample was sent to [facility] for analysis of the Common Hereditary cancer panel + RNA. Results should be available within approximately 2-3 weeks' time, at which point they will be disclosed by telephone to Don Meyer, as will any additional recommendations warranted by these results. Don Meyer will receive a summary of his genetic counseling visit and a copy of his results once available. This information will also be available in Epic.   Lastly, we encouraged Don Meyer to remain in contact with cancer genetics annually so that we can continuously update the family history and inform him of any changes in cancer genetics and testing that may be of benefit for this family.   Don Meyer questions were answered to his satisfaction today. Our contact information was provided should additional questions  or concerns arise. Thank you for the referral and allowing [place] to share in the care of your patient.   Don Meyer P. Fl[place]ne [place], [place], [facility] Licensed, Insurance risk surveyor [name] phone: [phone]  The patient was seen for a total of 45 minutes in face-to-face genetic counseling.  This patient was discussed with Drs. Magrinat, Lindi Adie and/or Burr Medico who agrees with the above.    _______________________________________________________________________ For Office Staff:  Number of people involved in session: 1 Was an Intern/ student involved with case: no

## 2020-09-01 ENCOUNTER — Ambulatory Visit: Payer: Self-pay | Admitting: Genetic Counselor

## 2020-09-01 ENCOUNTER — Telehealth: Payer: Self-pay | Admitting: Genetic Counselor

## 2020-09-01 ENCOUNTER — Encounter: Payer: Self-pay | Admitting: Genetic Counselor

## 2020-09-01 DIAGNOSIS — Z1379 Encounter for other screening for genetic and chromosomal anomalies: Secondary | ICD-10-CM

## 2020-09-01 NOTE — Telephone Encounter (Signed)
Revealed negative genetic testing.  Discussed that we do not know why he has colon cancer or why there is cancer in the family. It could be due to a different gene that we are not testing, or maybe our current technology may not be able to pick something up.  It will be important for him to keep in contact with genetics to keep up with whether additional testing may be needed. ° ° °

## 2020-09-01 NOTE — Progress Notes (Signed)
HPI:  Mr. Lamagna was previously seen in the [place] clinic due to a personal and family history of cancer and concerns regarding a hereditary predisposition to cancer. Please refer to our prior cancer genetics clinic note for more information regarding our discussion, assessment and recommendations, at the time. Mr. Weil recent genetic test results were disclosed to him, as were recommendations warranted by these results. These results and recommendations are discussed in more detail below.  CANCER HISTORY:  Oncology History  Colon cancer ([place])  08/12/2020 Initial Diagnosis   Colon cancer ([place])   08/30/2020 Genetic Testing   Negative genetic testing on the Common Hereditary Cancer Panel +RNA.  The Common Hereditary Gene Panel offered by [facility] includes sequencing and/or deletion duplication testing of the following 47 genes: APC, ATM, AXIN2, BARD1, BMPR1A, BRCA1, BRCA2, BRIP1, CDH1, CDK4, CDKN2A (p14ARF), CDKN2A (p16INK4a), CHEK2, CTNNA1, DICER1, EPCAM (Deletion/duplication testing only), GREM1 (promoter region deletion/duplication testing only), KIT, MEN1, MLH1, MSH2, MSH3, MSH6, MUTYH, NBN, NF1, NHTL1, PALB2, PDGFRA, PMS2, POLD1, POLE, PTEN, RAD50, RAD51C, RAD51D, SDHB, SDHC, SDHD, SMAD4, SMARCA4. STK11, TP53, TSC1, TSC2, and VHL.  The following genes were evaluated for sequence changes only: SDHA and HOXB13 c.251G>A variant only. The report date is Aug 30, 2020.     FAMILY HISTORY:  We obtained a detailed, 4-generation family history.  Significant diagnoses are listed below: Family History  Problem Relation Age of Onset  . Heart disease Mother   . COPD Father   . Drug abuse Brother        d 62s  . Irritable bowel syndrome Maternal Uncle   . Prostate cancer Paternal Uncle        d. "young"  . Lung cancer Maternal Grandmother        mets to liver  . Alcohol abuse Maternal Grandfather   . Cancer Paternal Grandfather        thought to be asbestos related  . Breast  cancer Other        dx in her 50s; MGM's sister  . Prostate cancer Other        MGMs brother; d. in his 43s-40s    The patient has a son and daughter who are cancer free.  He had one brother who died of a drug overdose.  His father is deceased and his mother is living.  The patient's father died from complications of COPD and treatment.  He had two half brothers, one who had prostate cancer 'young' and the other had cancer NOS.  The paternal grandparents are both deceased, possibly from cancer.  The patient's mother is cancer free.  She has a maternal half brother who is living and has IBS, but no cancer.  Her mother died of lung cancer and her father from alcohol related problems.  Her mother had a brother and sister, the sister had breast cancer in her 59's and the brother had prostate cancer in his 67's-40's.  The great grandparents both had cancer NOS, and the great grandmother's sister had breast cancer.  Mr. Giovanetti is unaware of previous family history of genetic testing for hereditary cancer risks. Patient's maternal ancestors are of [place], [place] and [place] descent, and paternal ancestors are of [place] descent. There is no reported Ashkenazi Jewish ancestry. There is no known consanguinity.    GENETIC TEST RESULTS: Genetic testing reported out on Aug 30, 2020 through the Multi-cancer panel+RNA found no pathogenic mutations. The Multi-Gene Panel offered by [facility] includes sequencing and/or deletion duplication testing of  the following 85 genes: AIP, ALK, APC, ATM, AXIN2,BAP1,  BARD1, BLM, BMPR1A, BRCA1, BRCA2, BRIP1, CASR, CDC73, CDH1, CDK4, CDKN1B, CDKN1C, CDKN2A (p14ARF), CDKN2A (p16INK4a), CEBPA, CHEK2, CTNNA1, DICER1, DIS3L2, EGFR (c.2369C>T, p.Thr790Met variant only), EPCAM (Deletion/duplication testing only), FH, FLCN, GATA2, GPC3, GREM1 (Promoter region deletion/duplication testing only), HOXB13 (c.251G>A, p.Gly84Glu), HRAS, KIT, MAX, MEN1, MET, MITF (c.952G>A, p.Glu318Lys variant  only), MLH1, MSH2, MSH3, MSH6, MUTYH, NBN, NF1, NF2, NTHL1, PALB2, PDGFRA, PHOX2B, PMS2, POLD1, POLE, POT1, PRKAR1A, PTCH1, PTEN, RAD50, RAD51C, RAD51D, RB1, RECQL4, RET, RNF43, RUNX1, SDHAF2, SDHA (sequence changes only), SDHB, SDHC, SDHD, SMAD4, SMARCA4, SMARCB1, SMARCE1, STK11, SUFU, TERC, TERT, TMEM127, TP53, TSC1, TSC2, VHL, WRN and WT1.  The test report has been scanned into EPIC and is located under the Molecular Pathology section of the Results Review tab.  A portion of the result report is included below for reference.     We discussed with Mr. Wynter that because current genetic testing is not perfect, it is possible there may be a gene mutation in one of these genes that current testing cannot detect, but that chance is small.  We also discussed, that there could be another gene that has not yet been discovered, or that we have not yet tested, that is responsible for the cancer diagnoses in the family. It is also possible there is a hereditary cause for the cancer in the family that Mr. Halteman did not inherit and therefore was not identified in his testing.  Therefore, it is important to remain in touch with cancer genetics in the future so that we can continue to offer Mr. Willinger the most up to date genetic testing.   ADDITIONAL GENETIC TESTING: We discussed with Mr. Studer that his genetic testing was fairly extensive.  If there are genes identified to increase cancer risk that can be analyzed in the future, we would be happy to discuss and coordinate this testing at that time.    CANCER SCREENING RECOMMENDATIONS: Mr. Pfeifer test result is considered negative (normal).  This means that we have not identified a hereditary cause for his personal and family history of cancer at this time. Most cancers happen by chance and this negative test suggests that his cancer may fall into this category.    While reassuring, this does not definitively rule out a hereditary predisposition to cancer. It is still  possible that there could be genetic mutations that are undetectable by current technology. There could be genetic mutations in genes that have not been tested or identified to increase cancer risk.  Therefore, it is recommended he continue to follow the cancer management and screening guidelines provided by his oncology and primary healthcare provider.   An individual's cancer risk and medical management are not determined by genetic test results alone. Overall cancer risk assessment incorporates additional factors, including personal medical history, family history, and any available genetic information that may result in a personalized plan for cancer prevention and surveillance  This negative genetic test simply tells [place] that we cannot yet define why Mr. Dieudonne has had colorectal cancer at a young age. Mr. Bartnik medical management and screening should be based on the prospect that he may be at an increased risk for a second colorectal cancer in the future and should, therefore, undergo more frequent colonoscopy screening at intervals determined by his GI providers.  We also recommended that Mr. Dotzler have an upper endoscopy periodically.  RECOMMENDATIONS FOR FAMILY MEMBERS:  Individuals in this family might be at some increased risk  of developing cancer, over the general population risk, simply due to the family history of cancer.  We recommended women in this family have a yearly mammogram beginning at age 31, or 67 years younger than the earliest onset of cancer, an annual clinical breast exam, and perform monthly breast self-exams. Women in this family should also have a gynecological exam as recommended by their primary provider. All family members should be referred for colonoscopy starting at age 6.  FOLLOW-UP: Lastly, we discussed with Mr. Pulsifer that cancer genetics is a rapidly advancing field and it is possible that new genetic tests will be appropriate for him and/or his family members in the  future. We encouraged him to remain in contact with cancer genetics on an annual basis so we can update his personal and family histories and let him know of advances in cancer genetics that may benefit this family.   Our contact number was provided. Mr. Schroth questions were answered to his satisfaction, and he knows he is welcome to call us at anytime with additional questions or concerns.   Roma Kayser, [place], [facility] Licensed, Certified Genetic Counselor [name]

## 2020-09-17 NOTE — Progress Notes (Signed)
Attempted to obtain medical history via telephone, unable to reach at this time. I left a voicemail to return pre surgical testing department's phone call.  

## 2020-09-22 ENCOUNTER — Ambulatory Visit (HOSPITAL_COMMUNITY): Payer: 59 | Admitting: Anesthesiology

## 2020-09-22 ENCOUNTER — Encounter (HOSPITAL_COMMUNITY)
Admission: RE | Disposition: A | Discharge status: Home / Self Care | Payer: Self-pay | Source: Home / Self Care | Attending: Surgery

## 2020-09-22 ENCOUNTER — Ambulatory Visit (HOSPITAL_COMMUNITY)
Admission: RE | Admit: 2020-09-22 | Discharge: 2020-09-22 | Disposition: A | Discharge status: Home / Self Care | Payer: 59 | Attending: Surgery | Admitting: Surgery

## 2020-09-22 ENCOUNTER — Encounter (HOSPITAL_COMMUNITY): Payer: Self-pay | Admitting: Surgery

## 2020-09-22 ENCOUNTER — Other Ambulatory Visit: Payer: Self-pay

## 2020-09-22 DIAGNOSIS — Z79899 Other long term (current) drug therapy: Secondary | ICD-10-CM | POA: Insufficient documentation

## 2020-09-22 DIAGNOSIS — Z01818 Encounter for other preprocedural examination: Secondary | ICD-10-CM | POA: Insufficient documentation

## 2020-09-22 DIAGNOSIS — E782 Mixed hyperlipidemia: Secondary | ICD-10-CM | POA: Diagnosis not present

## 2020-09-22 DIAGNOSIS — Z980 Intestinal bypass and anastomosis status: Secondary | ICD-10-CM | POA: Diagnosis not present

## 2020-09-22 DIAGNOSIS — I10 Essential (primary) hypertension: Secondary | ICD-10-CM | POA: Diagnosis not present

## 2020-09-22 DIAGNOSIS — Z85038 Personal history of other malignant neoplasm of large intestine: Secondary | ICD-10-CM | POA: Insufficient documentation

## 2020-09-22 DIAGNOSIS — C189 Malignant neoplasm of colon, unspecified: Secondary | ICD-10-CM | POA: Diagnosis not present

## 2020-09-22 HISTORY — PX: POUCHOSCOPY: SHX6321

## 2020-09-22 SURGERY — ENDOSCOPY, POUCH, SMALL INTESTINE, DIAGNOSTIC
Anesthesia: Monitor Anesthesia Care

## 2020-09-22 MED ORDER — LACTATED RINGERS IV SOLN: INTRAVENOUS | Status: DC

## 2020-09-22 MED ORDER — LACTATED RINGERS IV SOLN
INTRAVENOUS | Status: AC | PRN
  Administered 2020-09-22: 1000 mL via INTRAVENOUS

## 2020-09-22 MED ORDER — PROPOFOL 10 MG/ML IV BOLUS
INTRAVENOUS | Status: DC | PRN
  Administered 2020-09-22 (×4): 50 mg via INTRAVENOUS

## 2020-09-22 MED ORDER — PHENYLEPHRINE HCL (PRESSORS) 10 MG/ML IV SOLN
INTRAVENOUS | Status: AC
  Filled 2020-09-22: qty 1

## 2020-09-22 MED ORDER — LIDOCAINE 2% (20 MG/ML) 5 ML SYRINGE
INTRAMUSCULAR | Status: DC | PRN
  Administered 2020-09-22: 40 mg via INTRAVENOUS

## 2020-09-22 NOTE — Anesthesia Procedure Notes (Signed)
Procedure Name: MAC Date/Time: 09/22/2020 7:19 AM Performed by: Cynda Familia, CRNA Pre-anesthesia Checklist: Patient identified, Suction available, Emergency Drugs available, Patient being monitored and Timeout performed Patient Re-evaluated:Patient Re-evaluated prior to induction Oxygen Delivery Method: Simple face mask Placement Confirmation: positive ETCO2 and breath sounds checked- equal and bilateral Dental Injury: Teeth and Oropharynx as per pre-operative assessment

## 2020-09-22 NOTE — Anesthesia Postprocedure Evaluation (Signed)
Anesthesia Post Note  Patient: Don Meyer  Procedure(s) Performed: POUCHOSCOPY     Patient location during evaluation: Endoscopy Anesthesia Type: MAC Level of consciousness: awake and alert Pain management: pain level controlled Vital Signs Assessment: post-procedure vital signs reviewed and stable Respiratory status: spontaneous breathing, nonlabored ventilation, respiratory function stable and patient connected to nasal cannula oxygen Cardiovascular status: stable and blood pressure returned to baseline Postop Assessment: no apparent nausea or vomiting Anesthetic complications: no   No notable events documented.  Last Vitals:  Vitals:   09/22/20 0800 09/22/20 0803  BP: (!) 92/48 (!) 95/50  Pulse: 65 67  Resp: 15 14  Temp:    SpO2: 95% 95%    Last Pain:  Vitals:   09/22/20 0750  TempSrc:   PainSc: 0-No pain                 March Rummage Jalysa Swopes

## 2020-09-22 NOTE — Transfer of Care (Signed)
Immediate Anesthesia Transfer of Care Note  Patient: Don Meyer  Procedure(s) Performed: POUCHOSCOPY  Patient Location: PACU and Endoscopy Unit  Anesthesia Type:MAC  Level of Consciousness: awake and alert   Airway & Oxygen Therapy: Patient Spontanous Breathing and Patient connected to face mask oxygen  Post-op Assessment: Report given to RN and Post -op Vital signs reviewed and stable  Post vital signs: Reviewed and stable  Last Vitals:  Vitals Value Taken Time  BP    Temp    Pulse 66 09/22/20 0736  Resp 16 09/22/20 0736  SpO2 100 % 09/22/20 0736  Vitals shown include unvalidated device data.  Last Pain:  Vitals:   09/22/20 0626  TempSrc: Oral  PainSc: 0-No pain         Complications: No notable events documented.

## 2020-09-22 NOTE — Op Note (Signed)
[facility] Patient Name: Don Meyer Procedure Date: 09/22/2020 MRN: [id number] Attending MD: Ileana Roup MD, MD Date of Birth: 1976-01-27 CSN: [id number] Age: 45 Admit Type: Outpatient Procedure:                Flexible Sigmoidoscopy Indications:              Preoperative assessment Providers:                Sharon Mt. Klayton Monie MD, MD, Nelia Shi,                            RN, Fransico Setters Mbumina, Technician Referring MD:              Medicines:                Monitored Anesthesia Care Complications:            No immediate complications. Estimated Blood Loss:     Estimated blood loss: none. Procedure:                Pre-Anesthesia Assessment:                           - Patient identification and proposed procedure                            were verified prior to the procedure by the                            physician, the nurse and the anesthetist. The                            procedure was verified in the pre-procedure area in                            the endoscopy suite.                           After obtaining informed consent, the scope was                            passed under direct vision. The [id number] ([id number])                            Olympus colonoscope was introduced through the anus                            and advanced to the 60 cm from the anal verge. The                            flexible sigmoidoscopy was accomplished without                            difficulty. The patient tolerated the procedure                            well. The quality of the bowel  preparation was                            adequate. Scope In: 7:24:35 AM Scope Out: 7:27:36 AM Total Procedure Duration: 0 hours 3 minutes 1 second  Findings:      The perianal and digital rectal examinations were normal.      A post-surgical anastomosis was found on digital exam. This was found to       be patent.      There was evidence of a prior ileal  pouch-anal anastomosis in the rectal       pouch. This was patent and was characterized by healthy appearing mucosa       and an intact staple line. The anastomosis was traversed. Estimated       blood loss: none. Impression:               - Patent post-surgical anastomosis found on digital                            exam.                           - Patent ileal pouch-anal anastomosis,                            characterized by healthy appearing mucosa and an                            intact staple line. Proximal ileal limb of pouch is                            also healthy and normal in appearance.                           - No specimens collected. Moderate Sedation:      Not Applicable - Patient had care per Anesthesia. Recommendation:           - Continue present medications.                           - Resume previous diet indefinitely. Procedure Code(s):        --- Professional ---                           [phone], Sigmoidoscopy, flexible; diagnostic,                            including collection of specimen(s) by brushing or                            washing, when performed (separate procedure) Diagnosis Code(s):        --- Professional ---                           J69.678, Encounter for other preprocedural  examination                           Z98.0, Intestinal bypass and anastomosis status CPT copyright 2019 American Medical Association. All rights reserved. The codes documented in this report are preliminary and upon coder review may  be revised to meet current compliance requirements. Nadeen Landau, MD Ileana Roup MD, MD 09/22/2020 7:42:35 AM This report has been signed electronically. Number of Addenda: 0

## 2020-09-22 NOTE — Anesthesia Preprocedure Evaluation (Signed)
Anesthesia Evaluation  Patient identified by MRN, date of birth, ID band Patient awake    Reviewed: Allergy & Precautions, NPO status , Patient's Chart, lab work & pertinent test results  Airway Mallampati: II  TM Distance: >3 FB Neck ROM: Full    Dental  (+) Teeth Intact   Pulmonary neg pulmonary ROS,    Pulmonary exam normal        Cardiovascular hypertension, Pt. on medications  Rhythm:Regular Rate:Normal     Neuro/Psych negative neurological ROS  negative psych ROS   GI/Hepatic Neg liver ROS, UC   Endo/Other  negative endocrine ROS  Renal/GU negative Renal ROS  negative genitourinary   Musculoskeletal negative musculoskeletal ROS (+)   Abdominal (+)  Abdomen: soft. Bowel sounds: normal.  Peds  Hematology negative hematology ROS (+)   Anesthesia Other Findings   Reproductive/Obstetrics                            Anesthesia Physical Anesthesia Plan  ASA: 2  Anesthesia Plan: MAC   Post-op Pain Management:    Induction: Intravenous  PONV Risk Score and Plan: 1 and Propofol infusion and Treatment may vary due to age or medical condition  Airway Management Planned: Simple Face Mask, Natural Airway and Nasal Cannula  Additional Equipment: None  Intra-op Plan:   Post-operative Plan:   Informed Consent: I have reviewed the patients History and Physical, chart, labs and discussed the procedure including the risks, benefits and alternatives for the proposed anesthesia with the patient or authorized representative who has indicated his/her understanding and acceptance.     Dental advisory given  Plan Discussed with:   Anesthesia Plan Comments: (Lab Results      Component                Value               Date                      WBC                      11.5 (H)            07/27/2020                HGB                      15.3                07/27/2020                 HCT                      45.1                07/27/2020                MCV                      89.7                07/27/2020                PLT                      279  07/27/2020          )        Anesthesia Quick Evaluation

## 2020-09-22 NOTE — H&P (Signed)
CC: Here for pouchoscopy  HPI: Don Meyer is an 45 y.o. male who is here for pouchoscopy. He underwent rPC with diverting loop ileostomy 07/24/20. He has recovered well and presents for pouchoscopy to plan ileostomy reversal. He denies any changes in his health or health history since he left from his surgery.  Past Medical History:  Diagnosis Date   Colon cancer ([place])    Family history of breast cancer    Family history of prostate cancer    Hyperlipidemia    Hypertension    Ulcerative colitis ([place])     Past Surgical History:  Procedure Laterality Date   COLONOSCOPY     DIVERTING ILEOSTOMY N/A 07/24/2020   Procedure: DIVERTING LOOP ILEOSTOMY;  Surgeon: Ileana Roup, MD;  Location: [facility];  Service: General;  Laterality: N/A;   ILEAL POUCH N/A 07/24/2020   Procedure: ILEAL J POUCH ANAL ANASTAMOSIS WITH POUCHOSCOPY;  Surgeon: Ileana Roup, MD;  Location: [facility];  Service: General;  Laterality: N/A;   WISDOM TOOTH EXTRACTION     XI ROBOTIC ASSISTED LOWER ANTERIOR RESECTION N/A 07/24/2020   Procedure: ROBOTIC RESTOREATIVE PROCTOCOLECTOMY AND BILATERAL TAP BLOCK;  Surgeon: Ileana Roup, MD;  Location: [facility];  Service: General;  Laterality: N/A;    Family History  Problem Relation Age of Onset   Heart disease Mother    COPD Father    Drug abuse Brother        d 75s   Irritable bowel syndrome Maternal Uncle    Prostate cancer Paternal Uncle        d. "young"   Lung cancer Maternal Grandmother        mets to liver   Alcohol abuse Maternal Grandfather    Cancer Paternal Grandfather        thought to be asbestos related   Breast cancer Other        dx in her 36s; MGM's sister   Prostate cancer Other        MGMs brother; d. in his 29s-40s    Social:  reports that he has never smoked. He has never used smokeless tobacco. He reports current alcohol use. He reports that he does not use drugs.  Allergies: No Known Allergies  Medications: I have reviewed  the patient's current medications.  No results found for this or any previous visit (from the past 48 hour(s)).  No results found.  ROS - all of the below systems have been reviewed with the patient and positives are indicated with bold text General: chills, fever or night sweats Eyes: blurry vision or double vision ENT: epistaxis or sore throat Allergy/Immunology: itchy/watery eyes or nasal congestion Hematologic/Lymphatic: bleeding problems, blood clots or swollen lymph nodes Endocrine: temperature intolerance or unexpected weight changes Breast: new or changing breast lumps or nipple discharge Resp: cough, shortness of breath, or wheezing CV: chest pain or dyspnea on exertion GI: as per HPI GU: dysuria, trouble voiding, or hematuria MSK: joint pain or joint stiffness Neuro: TIA or stroke symptoms Derm: pruritus and skin lesion changes Psych: anxiety and depression  PE Blood pressure (!) 97/57, pulse 80, temperature 98 F (36.7 C), temperature source Oral, resp. rate 20, height 5\' 8"  (1.727 m), weight 94.3 kg, SpO2 100 %. Constitutional: NAD; conversant Eyes: Moist conjunctiva; no lid lag; anicteric Neck: Trachea midline; no thyromegaly Lungs: Normal respiratory effort CV: RRR; no pitting edema GI: Abd soft, NT/ND, ileostomy working nicely MSK: Normal range of motion of extremities; no clubbing/cyanosis Psychiatric: Appropriate  affect; alert and oriented x3   No results found for this or any previous visit (from the past 48 hour(s)).  No results found.   A/P: Don Meyer is an 45 y.o. male here for pouchoscopy  -The anatomy and physiology of the GI tract was discussed as it pertains to his anatomy. We reviewed pouchoscopy.  -The planned procedure, material risks (including, but not limited to, pain, bleeding, perforation, need for additional procedures, worsening of pre-existing medical conditions, pneumonia, heart attack, stroke, death) benefits and alternatives to  surgery were discussed at length. The patient's questions were answered to their satisfaction, they voiced understanding and elected to proceed with surgery. Additionally, we discussed typical postoperative expectations and the recovery process.  Nadeen Landau, MD [facility] Use [email or web address] to contact on call provider

## 2020-09-24 ENCOUNTER — Encounter (HOSPITAL_COMMUNITY): Payer: Self-pay | Admitting: Surgery

## 2020-10-12 DIAGNOSIS — K51019 Ulcerative (chronic) pancolitis with unspecified complications: Secondary | ICD-10-CM | POA: Diagnosis not present

## 2020-10-29 ENCOUNTER — Other Ambulatory Visit: Payer: Self-pay | Admitting: Surgery

## 2020-10-29 DIAGNOSIS — K51019 Ulcerative (chronic) pancolitis with unspecified complications: Secondary | ICD-10-CM

## 2020-10-29 NOTE — Patient Instructions (Addendum)
DUE TO COVID-19 ONLY ONE VISITOR IS ALLOWED TO COME WITH YOU AND STAY IN THE WAITING ROOM ONLY DURING PRE OP AND PROCEDURE.   **NO VISITORS ARE ALLOWED IN THE SHORT STAY AREA OR RECOVERY ROOM!!**  IF YOU WILL BE ADMITTED INTO THE HOSPITAL YOU ARE ALLOWED ONLY TWO SUPPORT PEOPLE DURING VISITATION HOURS ONLY (10AM -8PM)   The support person(s) may change daily. The support person(s) must pass our screening, gel in and out, and wear a mask at all times, including in the patient's room. Patients must also wear a mask when staff or their support person are in the room.  No visitors under the age of 69. Any visitor under the age of 55 must be accompanied by an adult.    COVID SWAB TESTING MUST BE COMPLETED ON:  Wednesday, 11-18-20 between the hours of 8 and [address] [place] (backside of the building)  You are not required to quarantine, however you are required to wear a well-fitted mask when you are out and around people not in your household.  Hand Hygiene often Do NOT share personal items Notify your provider if you are in close contact with someone who has COVID or you develop fever 100.4 or greater, new onset of sneezing, cough, sore throat, shortness of breath or body aches.         Your procedure is scheduled on: Friday, 11-20-20   Report to [facility] Main  Entrance   Report to Short Stay at 5:15 AM   [facility])    Call this number if you have problems the morning of surgery [phone]   Do not eat food :After Midnight.   May have liquids until 4:30 AM  day of surgery  CLEAR LIQUID DIET  Foods Allowed                                                                     Foods Excluded  Water, Black Coffee and tea, regular and decaf             liquids that you cannot  Plain Jell-O in any flavor  (No red)                                   see through such as: Fruit ices (not with fruit pulp)                                       milk, soups, orange juice              Iced Popsicles (No red)                                      All solid food                                   Apple juices Sports drinks like  Gatorade (No red) Lightly seasoned clear broth or consume(fat free) Sugar, honey syrup     Complete two Pre-surgical Ensure drinks the night before surgery by 10:00 PM.  Complete one Ensure drink the morning of surgery at 4:30 AM the day of surgery.     The day of surgery:  Drink ONE (1) Pre-Surgery Clear Ensure the morning of surgery. Drink in one sitting. Do not sip.  This drink was given to you during your hospital  pre-op appointment visit. Nothing else to drink after completing the Pre-Surgery Clear Ensure          If you have questions, please contact your surgeon's office.     Oral Hygiene is also important to reduce your risk of infection.                                    Remember - BRUSH YOUR TEETH THE MORNING OF SURGERY WITH YOUR REGULAR TOOTHPASTE   Do NOT smoke after Midnight   Take these medicines the morning of surgery with A SIP OF WATER: None                               You may not have any metal on your body including  jewelry, and body piercing             Do not wear lotions, powders, cologne, or deodorant               Men may shave face and neck.   Do not bring valuables to the hospital. [place].   Contacts, dentures or bridgework may not be worn into surgery.   Bring small overnight bag day of surgery.   Please read over the following fact sheets you were given: IF YOU HAVE QUESTIONS ABOUT YOUR PRE OP INSTRUCTIONS PLEASE CALL [facility] - Preparing for Surgery Before surgery, you can play an important role.  Because skin is not sterile, your skin needs to be as free of germs as possible.  You can reduce the number of germs on your skin by washing with CHG (chlorahexidine gluconate) soap before surgery.  CHG  is an antiseptic cleaner which kills germs and bonds with the skin to continue killing germs even after washing. Please DO NOT use if you have an allergy to CHG or antibacterial soaps.  If your skin becomes reddened/irritated stop using the CHG and inform your nurse when you arrive at Short Stay. Do not shave (including legs and underarms) for at least 48 hours prior to the first CHG shower.  You may shave your face/neck.  Please follow these instructions carefully:  1.  Shower with CHG Soap the night before surgery and the  morning of surgery.  2.  If you choose to wash your hair, wash your hair first as usual with your normal  shampoo.  3.  After you shampoo, rinse your hair and body thoroughly to remove the shampoo.                             4.  Use CHG as you would any other liquid soap.  You can apply chg directly to the skin and wash.  Gently with a scrungie or clean washcloth.  5.  Apply the CHG Soap to your body ONLY FROM THE NECK DOWN.   Do   not use on face/ open                           Wound or open sores. Avoid contact with eyes, ears mouth and   genitals (private parts).                       Wash face,  Genitals (private parts) with your normal soap.             6.  Wash thoroughly, paying special attention to the area where your    surgery  will be performed.  7.  Thoroughly rinse your body with warm water from the neck down.  8.  DO NOT shower/wash with your normal soap after using and rinsing off the CHG Soap.                9.  Pat yourself dry with a clean towel.            10.  Wear clean pajamas.            11.  Place clean sheets on your bed the night of your first shower and do not  sleep with pets. Day of Surgery : Do not apply any lotions/deodorants the morning of surgery.  Please wear clean clothes to the hospital/surgery center.  FAILURE TO FOLLOW THESE INSTRUCTIONS MAY RESULT IN THE CANCELLATION OF YOUR SURGERY  PATIENT  SIGNATURE_________________________________  NURSE SIGNATURE__________________________________  ________________________________________________________________________     Adam Phenix  An incentive spirometer is a tool that can help keep your lungs clear and active. This tool measures how well you are filling your lungs with each breath. Taking long deep breaths may help reverse or decrease the chance of developing breathing (pulmonary) problems (especially infection) following: A long period of time when you are unable to move or be active. BEFORE THE PROCEDURE  If the spirometer includes an indicator to show your best effort, your nurse or respiratory therapist will set it to a desired goal. If possible, sit up straight or lean slightly forward. Try not to slouch. Hold the incentive spirometer in an upright position. INSTRUCTIONS FOR USE  Sit on the edge of your bed if possible, or sit up as far as you can in bed or on a chair. Hold the incentive spirometer in an upright position. Breathe out normally. Place the mouthpiece in your mouth and seal your lips tightly around it. Breathe in slowly and as deeply as possible, raising the piston or the ball toward the top of the column. Hold your breath for 3-5 seconds or for as long as possible. Allow the piston or ball to fall to the bottom of the column. Remove the mouthpiece from your mouth and breathe out normally. Rest for a few seconds and repeat Steps 1 through 7 at least 10 times every 1-2 hours when you are awake. Take your time and take a few normal breaths between deep breaths. The spirometer may include an indicator to show your best effort. Use the indicator as a goal to work toward during each repetition. After each set of 10 deep breaths, practice coughing to be sure your lungs are clear. If you have an incision (the cut made at the time of surgery), support your incision when coughing by placing a pillow or rolled up  towels firmly against it. Once you are able to get out of bed, walk around indoors and cough well. You may stop using the incentive spirometer when instructed by your caregiver.  RISKS AND COMPLICATIONS Take your time so you do not get dizzy or light-headed. If you are in pain, you may need to take or ask for pain medication before doing incentive spirometry. It is harder to take a deep breath if you are having pain. AFTER USE Rest and breathe slowly and easily. It can be helpful to keep track of a log of your progress. Your caregiver can provide you with a simple table to help with this. If you are using the spirometer at home, follow these instructions: [place] IF:  You are having difficultly using the spirometer. You have trouble using the spirometer as often as instructed. Your pain medication is not giving enough relief while using the spirometer. You develop fever of 100.5 F (38.1 C) or higher. SEEK IMMEDIATE MEDICAL CARE IF:  You cough up bloody sputum that had not been present before. You develop fever of 102 F (38.9 C) or greater. You develop worsening pain at or near the incision site. MAKE SURE YOU:  Understand these instructions. Will watch your condition. Will get help right away if you are not doing well or get worse. Document Released: 08/01/2006 Document Revised: 06/13/2011 Document Reviewed: 10/02/2006 Exit[employer] Patient Information 2014 [employer], [place].   ________________________________________________________________________  WHAT IS A BLOOD TRANSFUSION? Blood Transfusion Information  A transfusion is the replacement of blood or some of its parts. Blood is made up of multiple cells which provide different functions. Red blood cells carry oxygen and are used for blood loss replacement. White blood cells fight against infection. Platelets control bleeding. Plasma helps clot blood. Other blood products are available for specialized needs, such as  hemophilia or other clotting disorders. BEFORE THE TRANSFUSION  Who gives blood for transfusions?  Healthy volunteers who are fully evaluated to make sure their blood is safe. This is blood bank blood. Transfusion therapy is the safest it has ever been in the practice of medicine. Before blood is taken from a donor, a complete history is taken to make sure that person has no history of diseases nor engages in risky social behavior (examples are intravenous drug use or sexual activity with multiple partners). The donor's travel history is screened to minimize risk of transmitting infections, such as malaria. The donated blood is tested for signs of infectious diseases, such as HIV and hepatitis. The blood is then tested to be sure it is compatible with you in order to minimize the chance of a transfusion reaction. If you or a relative donates blood, this is often done in anticipation of surgery and is not appropriate for emergency situations. It takes many days to process the donated blood. RISKS AND COMPLICATIONS Although transfusion therapy is very safe and saves many lives, the main dangers of transfusion include:  Getting an infectious disease. Developing a transfusion reaction. This is an allergic reaction to something in the blood you were given. Every precaution is taken to prevent this. The decision to have a blood transfusion has been considered carefully by your caregiver before blood is given. Blood is not given unless the benefits outweigh the risks. AFTER THE TRANSFUSION Right after receiving a blood transfusion, you will usually feel much better and more energetic. This is especially true if your red blood cells have gotten low (anemic). The transfusion raises the level of the  red blood cells which carry oxygen, and this usually causes an energy increase. The nurse administering the transfusion will monitor you carefully for complications. HOME CARE INSTRUCTIONS  No special instructions  are needed after a transfusion. You may find your energy is better. Speak with your caregiver about any limitations on activity for underlying diseases you may have. SEEK MEDICAL CARE IF:  Your condition is not improving after your transfusion. You develop redness or irritation at the intravenous (IV) site. SEEK IMMEDIATE MEDICAL CARE IF:  Any of the following symptoms occur over the next 12 hours: Shaking chills. You have a temperature by mouth above 102 F (38.9 C), not controlled by medicine. Chest, back, or muscle pain. People around you feel you are not acting correctly or are confused. Shortness of breath or difficulty breathing. Dizziness and fainting. You get a rash or develop hives. You have a decrease in urine output. Your urine turns a dark color or changes to pink, red, or brown. Any of the following symptoms occur over the next 10 days: You have a temperature by mouth above 102 F (38.9 C), not controlled by medicine. Shortness of breath. Weakness after normal activity. The white part of the eye turns yellow (jaundice). You have a decrease in the amount of urine or are urinating less often. Your urine turns a dark color or changes to pink, red, or brown. Document Released: 03/18/2000 Document Revised: 06/13/2011 Document Reviewed: 11/05/2007 [facility] Patient Information 2014 [place], [place].  _______________________________________________________________________

## 2020-10-29 NOTE — Progress Notes (Addendum)
COVID Vaccine Completed: Yes x2 Date COVID Vaccine completed:  06-09-19 & 07-10-19 Has received booster:  Yes x1 COVID vaccine manufacturer: Pfizer      Date of COVID positive in last 90 days:  No  PCP - Deland Pretty, MD Cardiologist - Vernell Leep, MD  Chest x-ray - CT chest 05-29-20 Epic EKG - 01-06-20 Epic Stress Test - N/A ECHO - N/A Cardiac Cath - N/A Pacemaker/ICD device last checked: Spinal Cord Stimulator:  Sleep Study - N/A CPAP -   Fasting Blood Sugar - N/A Checks Blood Sugar _____ times a day  Blood Thinner Instructions: N/A Aspirin Instructions: Last Dose:  Activity level:  Can go up a flight of stairs and perform activities of daily living without stopping and without symptoms of chest pain or shortness of breath.  Able to exercise without symptoms  Anesthesia review:   Evaluated by cardiology for abnormal EKG  Patient denies shortness of breath, fever, cough and chest pain at PAT appointment   Patient verbalized understanding of instructions that were given to them at the PAT appointment. Patient was also instructed that they will need to review over the PAT instructions again at home before surgery.

## 2020-10-30 NOTE — Progress Notes (Signed)
Sent message, via epic in basket, requesting orders in epic from surgeon.  

## 2020-10-31 ENCOUNTER — Ambulatory Visit: Payer: Self-pay | Admitting: Surgery

## 2020-11-02 ENCOUNTER — Encounter (HOSPITAL_COMMUNITY): Payer: Self-pay

## 2020-11-02 ENCOUNTER — Encounter (HOSPITAL_COMMUNITY)
Admission: RE | Admit: 2020-11-02 | Discharge: 2020-11-02 | Disposition: A | Discharge status: Home / Self Care | Payer: 59 | Source: Ambulatory Visit | Attending: Surgery | Admitting: Surgery

## 2020-11-02 ENCOUNTER — Other Ambulatory Visit: Payer: Self-pay

## 2020-11-02 DIAGNOSIS — Z01812 Encounter for preprocedural laboratory examination: Secondary | ICD-10-CM | POA: Diagnosis not present

## 2020-11-02 LAB — CBC
HCT: 40.6 % (ref 39.0–52.0)
Hemoglobin: 13.4 g/dL (ref 13.0–17.0)
MCH: 30.2 pg (ref 26.0–34.0)
MCHC: 33 g/dL (ref 30.0–36.0)
MCV: 91.4 fL (ref 80.0–100.0)
Platelets: 311 10*3/uL (ref 150–400)
RBC: 4.44 MIL/uL (ref 4.22–5.81)
RDW: 14 % (ref 11.5–15.5)
WBC: 9 10*3/uL (ref 4.0–10.5)
nRBC: 0 % (ref 0.0–0.2)

## 2020-11-02 LAB — BASIC METABOLIC PANEL
Anion gap: 5 (ref 5–15)
BUN: 19 mg/dL (ref 6–20)
CO2: 22 mmol/L (ref 22–32)
Calcium: 9.4 mg/dL (ref 8.9–10.3)
Chloride: 108 mmol/L (ref 98–111)
Creatinine, Ser: 1.14 mg/dL (ref 0.61–1.24)
GFR, Estimated: >60 mL/min (ref >60)
Glucose, Bld: 79 mg/dL (ref 70–99)
Potassium: 4.4 mmol/L (ref 3.5–5.1)
Sodium: 135 mmol/L (ref 135–145)

## 2020-11-10 ENCOUNTER — Ambulatory Visit
Admission: RE | Admit: 2020-11-10 | Discharge: 2020-11-10 | Disposition: A | Discharge status: Home / Self Care | Payer: 59 | Source: Ambulatory Visit | Attending: Surgery | Admitting: Surgery

## 2020-11-10 ENCOUNTER — Other Ambulatory Visit: Payer: Self-pay | Admitting: Surgery

## 2020-11-10 ENCOUNTER — Other Ambulatory Visit: Payer: Self-pay

## 2020-11-10 DIAGNOSIS — K51019 Ulcerative (chronic) pancolitis with unspecified complications: Secondary | ICD-10-CM

## 2020-11-10 DIAGNOSIS — K5190 Ulcerative colitis, unspecified, without complications: Secondary | ICD-10-CM | POA: Diagnosis not present

## 2020-11-18 ENCOUNTER — Other Ambulatory Visit: Payer: Self-pay | Admitting: Surgery

## 2020-11-18 LAB — SARS CORONAVIRUS 2 (TAT 6-24 HRS): SARS Coronavirus 2: NEGATIVE

## 2020-11-19 MED ORDER — BUPIVACAINE LIPOSOME 1.3 % IJ SUSP
20.0000 mL | Freq: Once | INTRAMUSCULAR | Status: DC
  Filled 2020-11-19: qty 20

## 2020-11-19 NOTE — Anesthesia Preprocedure Evaluation (Addendum)
Anesthesia Evaluation  Patient identified by MRN, date of birth, ID band Patient awake    Reviewed: Allergy & Precautions, NPO status , Patient's Chart, lab work & pertinent test results  History of Anesthesia Complications Negative for: history of anesthetic complications  Airway Mallampati: III  TM Distance: >3 FB Neck ROM: Full    Dental  (+) Missing,    Pulmonary neg pulmonary ROS,    Pulmonary exam normal        Cardiovascular hypertension, Normal cardiovascular exam     Neuro/Psych negative neurological ROS  negative psych ROS   GI/Hepatic Neg liver ROS, Ulcerative colitis s/p colectomy   Endo/Other  negative endocrine ROS  Renal/GU negative Renal ROS  negative genitourinary   Musculoskeletal negative musculoskeletal ROS (+)   Abdominal   Peds  Hematology negative hematology ROS (+)   Anesthesia Other Findings Day of surgery medications reviewed with patient.  Reproductive/Obstetrics negative OB ROS                           Anesthesia Physical Anesthesia Plan  ASA: 2  Anesthesia Plan: General   Post-op Pain Management:    Induction: Intravenous  PONV Risk Score and Plan: 4 or greater and Treatment may vary due to age or medical condition, Midazolam, Dexamethasone and Ondansetron  Airway Management Planned: Oral ETT  Additional Equipment: None  Intra-op Plan:   Post-operative Plan: Extubation in OR  Informed Consent: I have reviewed the patients History and Physical, chart, labs and discussed the procedure including the risks, benefits and alternatives for the proposed anesthesia with the patient or authorized representative who has indicated his/her understanding and acceptance.     Dental advisory given  Plan Discussed with: CRNA  Anesthesia Plan Comments:        Anesthesia Quick Evaluation

## 2020-11-20 ENCOUNTER — Inpatient Hospital Stay (HOSPITAL_COMMUNITY)
Admission: RE | Admit: 2020-11-20 | Discharge: 2020-11-22 | DRG: 331 | Disposition: A | Discharge status: Home / Self Care | Payer: 59 | Source: Ambulatory Visit | Attending: Surgery | Admitting: Surgery

## 2020-11-20 ENCOUNTER — Encounter (HOSPITAL_COMMUNITY)
Admission: RE | Disposition: A | Discharge status: Home / Self Care | Payer: Self-pay | Source: Ambulatory Visit | Attending: Surgery

## 2020-11-20 ENCOUNTER — Inpatient Hospital Stay (HOSPITAL_COMMUNITY): Payer: 59 | Admitting: Physician Assistant

## 2020-11-20 ENCOUNTER — Encounter (HOSPITAL_COMMUNITY): Payer: Self-pay | Admitting: Surgery

## 2020-11-20 ENCOUNTER — Inpatient Hospital Stay (HOSPITAL_COMMUNITY): Payer: 59 | Admitting: Anesthesiology

## 2020-11-20 DIAGNOSIS — I10 Essential (primary) hypertension: Secondary | ICD-10-CM | POA: Diagnosis present

## 2020-11-20 DIAGNOSIS — E785 Hyperlipidemia, unspecified: Secondary | ICD-10-CM | POA: Diagnosis not present

## 2020-11-20 DIAGNOSIS — K5190 Ulcerative colitis, unspecified, without complications: Secondary | ICD-10-CM | POA: Diagnosis not present

## 2020-11-20 DIAGNOSIS — Z432 Encounter for attention to ileostomy: Secondary | ICD-10-CM | POA: Diagnosis not present

## 2020-11-20 DIAGNOSIS — Z9889 Other specified postprocedural states: Secondary | ICD-10-CM | POA: Diagnosis present

## 2020-11-20 DIAGNOSIS — E782 Mixed hyperlipidemia: Secondary | ICD-10-CM | POA: Diagnosis not present

## 2020-11-20 DIAGNOSIS — Z8249 Family history of ischemic heart disease and other diseases of the circulatory system: Secondary | ICD-10-CM | POA: Diagnosis not present

## 2020-11-20 DIAGNOSIS — Z85038 Personal history of other malignant neoplasm of large intestine: Secondary | ICD-10-CM

## 2020-11-20 HISTORY — PX: ILEO LOOP COLOSTOMY CLOSURE: SHX5257

## 2020-11-20 SURGERY — CLOSURE, ILEOSTOMY, LAPAROSCOPIC, WITH LAPAROTOMY IF INDICATED
Anesthesia: General | Site: Abdomen

## 2020-11-20 MED ORDER — ALUM & MAG HYDROXIDE-SIMETH 200-200-20 MG/5ML PO SUSP: 30.0000 mL | Freq: Four times a day (QID) | ORAL | Status: DC | PRN

## 2020-11-20 MED ORDER — ENSURE PRE-SURGERY PO LIQD: 592.0000 mL | Freq: Once | ORAL | Status: DC

## 2020-11-20 MED ORDER — HEPARIN SODIUM (PORCINE) 5000 UNIT/ML IJ SOLN
5000.0000 [IU] | Freq: Three times a day (TID) | INTRAMUSCULAR | Status: DC
  Administered 2020-11-20 – 2020-11-22 (×6): 5000 [IU] via SUBCUTANEOUS
  Filled 2020-11-20 (×6): qty 1

## 2020-11-20 MED ORDER — FENTANYL CITRATE (PF) 100 MCG/2ML IJ SOLN: 25.0000 ug | INTRAMUSCULAR | Status: DC | PRN

## 2020-11-20 MED ORDER — SODIUM CHLORIDE 0.9 % IV SOLN
2.0000 g | INTRAVENOUS | Status: AC
  Administered 2020-11-20: 2 g via INTRAVENOUS
  Filled 2020-11-20: qty 2

## 2020-11-20 MED ORDER — DEXAMETHASONE SODIUM PHOSPHATE 10 MG/ML IJ SOLN
INTRAMUSCULAR | Status: DC | PRN
  Administered 2020-11-20: 10 mg via INTRAVENOUS

## 2020-11-20 MED ORDER — ACETAMINOPHEN 500 MG PO TABS: 1000.0000 mg | ORAL_TABLET | ORAL | Status: DC

## 2020-11-20 MED ORDER — ONDANSETRON HCL 4 MG/2ML IJ SOLN
INTRAMUSCULAR | Status: DC | PRN
  Administered 2020-11-20: 4 mg via INTRAVENOUS

## 2020-11-20 MED ORDER — PROMETHAZINE HCL 25 MG/ML IJ SOLN
6.2500 mg | INTRAMUSCULAR | Status: DC | PRN
Start: 2020-11-20 — End: 2020-11-20

## 2020-11-20 MED ORDER — SUGAMMADEX SODIUM 200 MG/2ML IV SOLN
INTRAVENOUS | Status: DC | PRN
  Administered 2020-11-20: 200 mg via INTRAVENOUS

## 2020-11-20 MED ORDER — CHLORHEXIDINE GLUCONATE 0.12 % MT SOLN
15.0000 mL | Freq: Once | OROMUCOSAL | Status: AC
  Administered 2020-11-20: 15 mL via OROMUCOSAL

## 2020-11-20 MED ORDER — ORAL CARE MOUTH RINSE: 15.0000 mL | Freq: Once | OROMUCOSAL | Status: AC

## 2020-11-20 MED ORDER — LIDOCAINE 2% (20 MG/ML) 5 ML SYRINGE
INTRAMUSCULAR | Status: DC | PRN
  Administered 2020-11-20: 100 mg via INTRAVENOUS

## 2020-11-20 MED ORDER — ALVIMOPAN 12 MG PO CAPS
12.0000 mg | ORAL_CAPSULE | ORAL | Status: AC
  Administered 2020-11-20: 12 mg via ORAL
  Filled 2020-11-20: qty 1

## 2020-11-20 MED ORDER — ENSURE SURGERY PO LIQD
237.0000 mL | Freq: Two times a day (BID) | ORAL | Status: DC
  Administered 2020-11-20 – 2020-11-22 (×4): 237 mL via ORAL

## 2020-11-20 MED ORDER — ROCURONIUM BROMIDE 10 MG/ML (PF) SYRINGE
PREFILLED_SYRINGE | INTRAVENOUS | Status: AC
  Filled 2020-11-20: qty 10

## 2020-11-20 MED ORDER — LACTATED RINGERS IV SOLN: INTRAVENOUS | Status: DC

## 2020-11-20 MED ORDER — MIDAZOLAM HCL 2 MG/2ML IJ SOLN
INTRAMUSCULAR | Status: DC | PRN
  Administered 2020-11-20: 2 mg via INTRAVENOUS

## 2020-11-20 MED ORDER — ALVIMOPAN 12 MG PO CAPS
12.0000 mg | ORAL_CAPSULE | Freq: Two times a day (BID) | ORAL | Status: DC
  Filled 2020-11-20: qty 1

## 2020-11-20 MED ORDER — FENTANYL CITRATE (PF) 250 MCG/5ML IJ SOLN
INTRAMUSCULAR | Status: AC
  Filled 2020-11-20: qty 5

## 2020-11-20 MED ORDER — HYDROMORPHONE HCL 1 MG/ML IJ SOLN: 0.5000 mg | INTRAMUSCULAR | Status: DC | PRN

## 2020-11-20 MED ORDER — PROPOFOL 10 MG/ML IV BOLUS
INTRAVENOUS | Status: DC | PRN
  Administered 2020-11-20: 200 mg via INTRAVENOUS

## 2020-11-20 MED ORDER — DIPHENHYDRAMINE HCL 12.5 MG/5ML PO ELIX: 12.5000 mg | ORAL_SOLUTION | Freq: Four times a day (QID) | ORAL | Status: DC | PRN

## 2020-11-20 MED ORDER — IBUPROFEN 400 MG PO TABS
600.0000 mg | ORAL_TABLET | Freq: Four times a day (QID) | ORAL | Status: DC | PRN
  Administered 2020-11-20: 600 mg via ORAL
  Filled 2020-11-20: qty 1

## 2020-11-20 MED ORDER — BUPIVACAINE-EPINEPHRINE 0.25% -1:200000 IJ SOLN
INTRAMUSCULAR | Status: DC | PRN
  Administered 2020-11-20: 30 mL

## 2020-11-20 MED ORDER — ENSURE PRE-SURGERY PO LIQD: 296.0000 mL | Freq: Once | ORAL | Status: DC

## 2020-11-20 MED ORDER — PSYLLIUM 95 % PO PACK
1.0000 | PACK | Freq: Two times a day (BID) | ORAL | Status: DC
  Administered 2020-11-20 – 2020-11-22 (×5): 1 via ORAL
  Filled 2020-11-20 (×5): qty 1

## 2020-11-20 MED ORDER — HYDRALAZINE HCL 20 MG/ML IJ SOLN
10.0000 mg | INTRAMUSCULAR | Status: DC | PRN
Start: 2020-11-20 — End: 2020-11-22

## 2020-11-20 MED ORDER — ROCURONIUM BROMIDE 10 MG/ML (PF) SYRINGE
PREFILLED_SYRINGE | INTRAVENOUS | Status: DC | PRN
  Administered 2020-11-20: 70 mg via INTRAVENOUS

## 2020-11-20 MED ORDER — CHLORHEXIDINE GLUCONATE CLOTH 2 % EX PADS: 6.0000 | MEDICATED_PAD | Freq: Once | CUTANEOUS | Status: DC

## 2020-11-20 MED ORDER — FENTANYL CITRATE (PF) 100 MCG/2ML IJ SOLN
INTRAMUSCULAR | Status: DC | PRN
  Administered 2020-11-20: 50 ug via INTRAVENOUS
  Administered 2020-11-20: 100 ug via INTRAVENOUS
  Administered 2020-11-20 (×2): 50 ug via INTRAVENOUS

## 2020-11-20 MED ORDER — PHENYLEPHRINE 40 MCG/ML (10ML) SYRINGE FOR IV PUSH (FOR BLOOD PRESSURE SUPPORT)
PREFILLED_SYRINGE | INTRAVENOUS | Status: AC
  Filled 2020-11-20: qty 10

## 2020-11-20 MED ORDER — HEPARIN SODIUM (PORCINE) 5000 UNIT/ML IJ SOLN
5000.0000 [IU] | Freq: Once | INTRAMUSCULAR | Status: AC
  Administered 2020-11-20: 5000 [IU] via SUBCUTANEOUS
  Filled 2020-11-20: qty 1

## 2020-11-20 MED ORDER — TRAMADOL HCL 50 MG PO TABS
50.0000 mg | ORAL_TABLET | Freq: Four times a day (QID) | ORAL | Status: DC | PRN
  Filled 2020-11-20: qty 1

## 2020-11-20 MED ORDER — MIDAZOLAM HCL 2 MG/2ML IJ SOLN
INTRAMUSCULAR | Status: AC
  Filled 2020-11-20: qty 2

## 2020-11-20 MED ORDER — ONDANSETRON HCL 4 MG/2ML IJ SOLN: 4.0000 mg | Freq: Four times a day (QID) | INTRAMUSCULAR | Status: DC | PRN

## 2020-11-20 MED ORDER — BUPIVACAINE LIPOSOME 1.3 % IJ SUSP
INTRAMUSCULAR | Status: DC | PRN
  Administered 2020-11-20: 20 mL

## 2020-11-20 MED ORDER — OXYCODONE HCL 5 MG/5ML PO SOLN: 5.0000 mg | Freq: Once | ORAL | Status: DC | PRN

## 2020-11-20 MED ORDER — OXYCODONE HCL 5 MG PO TABS
5.0000 mg | ORAL_TABLET | Freq: Once | ORAL | Status: DC | PRN
Start: 2020-11-20 — End: 2020-11-20

## 2020-11-20 MED ORDER — 0.9 % SODIUM CHLORIDE (POUR BTL) OPTIME
TOPICAL | Status: DC | PRN
  Administered 2020-11-20: 1000 mL

## 2020-11-20 MED ORDER — LIDOCAINE 2% (20 MG/ML) 5 ML SYRINGE
INTRAMUSCULAR | Status: AC
  Filled 2020-11-20: qty 5

## 2020-11-20 MED ORDER — ACETAMINOPHEN 500 MG PO TABS
1000.0000 mg | ORAL_TABLET | Freq: Once | ORAL | Status: AC
  Administered 2020-11-20: 1000 mg via ORAL
  Filled 2020-11-20: qty 2

## 2020-11-20 MED ORDER — SIMETHICONE 80 MG PO CHEW
40.0000 mg | CHEWABLE_TABLET | Freq: Four times a day (QID) | ORAL | Status: DC | PRN
  Filled 2020-11-20: qty 1

## 2020-11-20 MED ORDER — PROPOFOL 500 MG/50ML IV EMUL
INTRAVENOUS | Status: AC
  Filled 2020-11-20: qty 50

## 2020-11-20 MED ORDER — DEXAMETHASONE SODIUM PHOSPHATE 10 MG/ML IJ SOLN
INTRAMUSCULAR | Status: AC
  Filled 2020-11-20: qty 1

## 2020-11-20 MED ORDER — PHENYLEPHRINE 40 MCG/ML (10ML) SYRINGE FOR IV PUSH (FOR BLOOD PRESSURE SUPPORT)
PREFILLED_SYRINGE | INTRAVENOUS | Status: DC | PRN
  Administered 2020-11-20 (×4): 80 ug via INTRAVENOUS

## 2020-11-20 MED ORDER — BUPIVACAINE-EPINEPHRINE (PF) 0.25% -1:200000 IJ SOLN
INTRAMUSCULAR | Status: AC
  Filled 2020-11-20: qty 30

## 2020-11-20 MED ORDER — DIPHENHYDRAMINE HCL 50 MG/ML IJ SOLN: 12.5000 mg | Freq: Four times a day (QID) | INTRAMUSCULAR | Status: DC | PRN

## 2020-11-20 MED ORDER — ONDANSETRON HCL 4 MG PO TABS: 4.0000 mg | ORAL_TABLET | Freq: Four times a day (QID) | ORAL | Status: DC | PRN

## 2020-11-20 MED ORDER — ONDANSETRON HCL 4 MG/2ML IJ SOLN
INTRAMUSCULAR | Status: AC
  Filled 2020-11-20: qty 2

## 2020-11-20 MED ORDER — ACETAMINOPHEN 500 MG PO TABS
1000.0000 mg | ORAL_TABLET | Freq: Four times a day (QID) | ORAL | Status: DC
  Administered 2020-11-20 – 2020-11-21 (×7): 1000 mg via ORAL
  Filled 2020-11-20 (×7): qty 2

## 2020-11-20 SURGICAL SUPPLY — 42 items
APPLIER CLIP 5 13 M/L LIGAMAX5 (MISCELLANEOUS)
BAG COUNTER SPONGE SURGICOUNT (BAG) IMPLANT
CELLS DAT CNTRL 66122 CELL SVR (MISCELLANEOUS) IMPLANT
CLIP APPLIE 5 13 M/L LIGAMAX5 (MISCELLANEOUS) IMPLANT
DRAIN CHANNEL 19F RND (DRAIN) IMPLANT
DRSG PAD ABDOMINAL 8X10 ST (GAUZE/BANDAGES/DRESSINGS) ×2 IMPLANT
DRSG TEGADERM 4X4.75 (GAUZE/BANDAGES/DRESSINGS) IMPLANT
ELECT REM PT RETURN 15FT ADLT (MISCELLANEOUS) ×2 IMPLANT
EVACUATOR SILICONE 100CC (DRAIN) IMPLANT
GAUZE 4X4 16PLY ~~LOC~~+RFID DBL (SPONGE) IMPLANT
GAUZE SPONGE 4X4 12PLY STRL (GAUZE/BANDAGES/DRESSINGS) ×2 IMPLANT
GLOVE SURG ENC MOIS LTX SZ6.5 (GLOVE) ×4 IMPLANT
GLOVE SURG UNDER POLY LF SZ7 (GLOVE) ×2 IMPLANT
KIT TURNOVER KIT A (KITS) ×2 IMPLANT
PACK GENERAL/GYN (CUSTOM PROCEDURE TRAY) ×2 IMPLANT
RELOAD PROXIMATE 75MM BLUE (ENDOMECHANICALS) ×4 IMPLANT
RTRCTR WOUND ALEXIS 18CM MED (MISCELLANEOUS)
STAPLER 90 3.5 STAND SLIM (STAPLE) ×2
STAPLER 90 3.5 STD SLIM (STAPLE) ×1 IMPLANT
STAPLER PROXIMATE 75MM BLUE (STAPLE) ×2 IMPLANT
STAPLER VISISTAT 35W (STAPLE) IMPLANT
SUT ETHILON 2 0 PS N (SUTURE) IMPLANT
SUT NOVA NAB DX-16 0-1 5-0 T12 (SUTURE) ×2 IMPLANT
SUT PDS AB 1 CTX 36 (SUTURE) IMPLANT
SUT PDS AB 1 TP1 96 (SUTURE) IMPLANT
SUT PROLENE 2 0 KS (SUTURE) IMPLANT
SUT SILK 2 0 (SUTURE) ×1
SUT SILK 2 0 SH CR/8 (SUTURE) IMPLANT
SUT SILK 2-0 18XBRD TIE 12 (SUTURE) ×1 IMPLANT
SUT SILK 3 0 (SUTURE)
SUT SILK 3 0 SH CR/8 (SUTURE) ×2 IMPLANT
SUT SILK 3-0 18XBRD TIE 12 (SUTURE) IMPLANT
SUT V-LOC BARB 180 2/0GR6 GS22 (SUTURE) ×4
SUT VIC AB 2-0 SH 18 (SUTURE) ×2 IMPLANT
SUTURE V-LC BRB 180 2/0GR6GS22 (SUTURE) ×2 IMPLANT
SYS LAPSCP GELPORT 120MM (MISCELLANEOUS)
SYS WOUND ALEXIS 18CM MED (MISCELLANEOUS)
SYSTEM LAPSCP GELPORT 120MM (MISCELLANEOUS) IMPLANT
SYSTEM WOUND ALEXIS 18CM MED (MISCELLANEOUS) IMPLANT
TAPE CLOTH SURG 4X10 WHT LF (GAUZE/BANDAGES/DRESSINGS) ×2 IMPLANT
TOWEL OR 17X26 10 PK STRL BLUE (TOWEL DISPOSABLE) ×4 IMPLANT
TOWEL OR NON WOVEN STRL DISP B (DISPOSABLE) IMPLANT

## 2020-11-20 NOTE — Op Note (Addendum)
11/20/2020  8:58 AM  PATIENT:  Don Meyer  45 y.o. male  Patient Care Team: Deland Pretty, MD as PCP - General (Internal Medicine)  PRE-OPERATIVE DIAGNOSIS:  Ileostomy status  POST-OPERATIVE DIAGNOSIS:  Same  PROCEDURE: Takedown of loop ileostomy Small bowel resection (ileostomy trim)  SURGEON:  Sharon Mt. Genni Buske, MD  ASSISTANT: Leighton Ruff, MD  ANESTHESIA:   local and general  COUNTS:  Sponge, needle and instrument counts were reported correct x2 at the conclusion of the operation.  EBL: 10 mL  DRAINS: None  SPECIMEN: Ileostomy  COMPLICATIONS: None  FINDINGS: Normal small bowel; ileostomy taken down without issue.  DISPOSITION: PACU in satisfactory condition  DESCRIPTION: The patient was identified in preop holding and taken to the OR where he was placed on the operating room table. SCDs were placed. General endotracheal anesthesia was induced without difficulty. Pressure points were padded. He was then prepped and draped in the usual sterile fashion. A surgical timeout was performed indicating the correct patient, procedure, positioning and need for preoperative antibiotics.   A parastomal skin incision was made and the subcutaneous tissue incised with cautery. The proximal and distal limbs of the loop ileostomy were identified and freed from the surrounding subcutaneous tissue and rectus fascia sharply with Metzenbaum scissors. The peritoneum was entered and all adhesions going to the stoma carefully lysed sharply. The proximal and distal limbs were completely freed circumferentially. The small bowel was then inspected and is free of any serosal/full thickness tears. The mesentery is free. Staying close to the ileostomy, windows are created in the associated mesentery and the small bowel divided on each respective limb using a GIA blue load stapler. The ileostomy is passed off as specimen. The small bowel is examined and well perfused out to the staple  lines.  Attention is turned to creating the small bowel anastomosis. Enterotomies are made by trimming the antimesenteric staple line on each respective limb. A 75 mm blue load GIA stapler is used to fashion the anastomosis. The anastomosis is inspected and hemostatic. A 3-0 silk apex stitch is placed. The common enterotomy is the closed with two running [place] with 2-0 V-loc sutures. The anastomosis is then palpated and noted to be widely patent, 3 fingerbreadths in length. The closure line is inspected and without gaps. Succus is milked to and fro and moves freely without extravasation. This is placed back into the abdomen.  The fascia is closed with 2 running #1 PDS sutures. Sponge needle and instrument counts were reported correct x2.  Additional local anesthetic with 0.25% Marcaine and Exparel was infiltrated  A pursestring skin stitch was placed using 2-0 Vicryl suture. The wound was loosely packed with a single moist 4x4 and then covered with dry 4x4s and dressed with ABD/tape. He was then awakened from anesthesia, extubated, and transferred to a stretcher for transport to PACU in satisfactory condition.

## 2020-11-20 NOTE — Transfer of Care (Signed)
Immediate Anesthesia Transfer of Care Note  Patient: Don Meyer  Procedure(s) Performed: TAKEDOWN LOOP ILEOSTOMY (Abdomen)  Patient Location: PACU  Anesthesia Type:General  Level of Consciousness: awake, alert  and oriented  Airway & Oxygen Therapy: Patient Spontanous Breathing and Patient connected to face mask oxygen  Post-op Assessment: Report given to RN and Post -op Vital signs reviewed and stable  Post vital signs: Reviewed and stable  Last Vitals:  Vitals Value Taken Time  BP 114/57 11/20/20 0906  Temp    Pulse 71 11/20/20 0908  Resp 12 11/20/20 0908  SpO2 100 % 11/20/20 0908  Vitals shown include unvalidated device data.  Last Pain:  Vitals:   11/20/20 0606  TempSrc: Oral  PainSc:          Complications: No notable events documented.

## 2020-11-20 NOTE — Anesthesia Procedure Notes (Signed)
Procedure Name: Intubation Date/Time: 11/20/2020 7:42 AM Performed by: Genelle Bal, CRNA Pre-anesthesia Checklist: Patient identified, Emergency Drugs available, Suction available and Patient being monitored Patient Re-evaluated:Patient Re-evaluated prior to induction Oxygen Delivery Method: Circle system utilized Preoxygenation: Pre-oxygenation with 100% oxygen Induction Type: IV induction Ventilation: Mask ventilation without difficulty and Oral airway inserted - appropriate to patient size Laryngoscope Size: Sabra Heck and 2 Grade View: Grade I Tube type: Oral Tube size: 7.5 mm Number of attempts: 1 Airway Equipment and Method: Stylet and Oral airway Placement Confirmation: ETT inserted through vocal cords under direct vision, positive ETCO2 and breath sounds checked- equal and bilateral Secured at: 23 cm Tube secured with: Tape Dental Injury: Teeth and Oropharynx as per pre-operative assessment

## 2020-11-20 NOTE — Anesthesia Postprocedure Evaluation (Signed)
Anesthesia Post Note  Patient: Don Meyer  Procedure(s) Performed: TAKEDOWN LOOP ILEOSTOMY (Abdomen)     Patient location during evaluation: PACU Anesthesia Type: General Level of consciousness: awake and alert and oriented Pain management: pain level controlled Vital Signs Assessment: post-procedure vital signs reviewed and stable Respiratory status: spontaneous breathing, nonlabored ventilation and respiratory function stable Cardiovascular status: blood pressure returned to baseline Postop Assessment: no apparent nausea or vomiting Anesthetic complications: no   No notable events documented.  Last Vitals:  Vitals:   11/20/20 0930 11/20/20 0945  BP: (!) 111/58 106/64  Pulse: 76 68  Resp: 17 16  Temp:    SpO2: 97% 98%    Last Pain:  Vitals:   11/20/20 0945  TempSrc:   PainSc: Asleep                 Marthenia Rolling

## 2020-11-20 NOTE — H&P (Signed)
CC: Here today for surgery  HPI: Don Meyer is an 45 y.o. male hx HTN, HLD, UC who is here for hx rPC with IPAA/DLI. He presents now for takedown of loop ileostomy. He denies any changes in his health or health hx since we met in June. He states he is ready for ileostomy reversal. We have spent time reviewing expectations therein with his ileal pouch.  Pouchoscopy 09/22/20 & pouch GGE  Past Medical History:  Diagnosis Date   Colon cancer ([facility] [place])    Family history of breast cancer    Family history of prostate cancer    Hyperlipidemia    Hypertension    Ulcerative colitis ([place])     Past Surgical History:  Procedure Laterality Date   COLONOSCOPY     DIVERTING ILEOSTOMY N/A 07/24/2020   Procedure: DIVERTING LOOP ILEOSTOMY;  Surgeon: Ileana Roup, MD;  Location: [facility];  Service: General;  Laterality: N/A;   ILEAL POUCH N/A 07/24/2020   Procedure: ILEAL J POUCH ANAL ANASTAMOSIS WITH POUCHOSCOPY;  Surgeon: Ileana Roup, MD;  Location: [facility];  Service: General;  Laterality: N/A;   POUCHOSCOPY N/A 09/22/2020   Procedure: POUCHOSCOPY;  Surgeon: Ileana Roup, MD;  Location: [facility];  Service: General;  Laterality: N/A;   WISDOM TOOTH EXTRACTION     XI ROBOTIC ASSISTED LOWER ANTERIOR RESECTION N/A 07/24/2020   Procedure: ROBOTIC RESTOREATIVE PROCTOCOLECTOMY AND BILATERAL TAP BLOCK;  Surgeon: Ileana Roup, MD;  Location: [facility];  Service: General;  Laterality: N/A;    Family History  Problem Relation Age of Onset   Heart disease Mother    COPD Father    Drug abuse Brother        d 36s   Irritable bowel syndrome Maternal Uncle    Prostate cancer Paternal Uncle        d. "young"   Lung cancer Maternal Grandmother        mets to liver   Alcohol abuse Maternal Grandfather    Cancer Paternal Grandfather        thought to be asbestos related   Breast cancer Other        dx in her 88s; MGM's sister   Prostate cancer Other        MGMs brother; d. in  his 43s-40s    Social:  reports that he has never smoked. He has never used smokeless tobacco. He reports current alcohol use. He reports that he does not use drugs.  Allergies: No Known Allergies  Medications: I have reviewed the patient's current medications.  Results for orders placed or performed during the hospital encounter of 11/20/20 (from the past 48 hour(s))  Type and screen [place]     Status: None   Collection Time: 11/20/20  5:40 AM  Result Value Ref Range   ABO/RH(D) AB POS    Antibody Screen POS    Sample Expiration      11/23/2020,2359 Performed at [facility], [place] [address], [place], [place] [address]     No results found.  ROS - all of the below systems have been reviewed with the patient and positives are indicated with bold text General: chills, fever or night sweats Eyes: blurry vision or double vision ENT: epistaxis or sore throat Allergy/Immunology: itchy/watery eyes or nasal congestion Hematologic/Lymphatic: bleeding problems, blood clots or swollen lymph nodes Endocrine: temperature intolerance or unexpected weight changes Breast: new or changing breast lumps or nipple discharge Resp: cough,  shortness of breath, or wheezing CV: chest pain or dyspnea on exertion GI: as per HPI GU: dysuria, trouble voiding, or hematuria MSK: joint pain or joint stiffness Neuro: TIA or stroke symptoms Derm: pruritus and skin lesion changes Psych: anxiety and depression  PE Blood pressure 102/67, pulse 66, temperature 98.3 F (36.8 C), temperature source Oral, resp. rate 18, weight 96.2 kg, SpO2 98 %. Constitutional: NAD; conversant Eyes: Moist conjunctiva; no lid lag; anicteric Lungs: Normal respiratory effort CV: RRR GI: Abd soft, NT/ND; no palpable hepatosplenomegaly MSK: Normal range of motion of extremities; no clubbing/cyanosis Psychiatric: Appropriate affect; alert and oriented x3   Results for orders placed or  performed during the hospital encounter of 11/20/20 (from the past 48 hour(s))  Type and screen [place]     Status: None   Collection Time: 11/20/20  5:40 AM  Result Value Ref Range   ABO/RH(D) AB POS    Antibody Screen POS    Sample Expiration      11/23/2020,2359 Performed at [facility], [place] [address], [place], [place] [address]     No results found.   A/P: Don Meyer is an 45 y.o. male with ileal J pouch here for loop ileostomy reversal  -The anatomy and physiology of the GI tract was discussed at length with the patient as it pertains to his current anatomy -We have discussed takedown of loop ileostomy -The planned procedure, material risks (including, but not limited to, pain, bleeding, infection, scarring, need for blood transfusion, damage to surrounding structures- blood vessels/nerves/viscus/organs,leak from anastomosis, need for additional procedures, need for stoma which may be permanent, worsening of pre-existing medical conditions, hernia, pneumonia, heart attack, stroke, death) benefits and alternatives to surgery were discussed at length. The patient's questions were answered to his satisfaction, he voiced understanding and elected to proceed with surgery. Additionally, we discussed typical postoperative expectations and the recovery process.  Nadeen Landau, MD [facility] Use [email or web address] to contact on call provider

## 2020-11-21 ENCOUNTER — Other Ambulatory Visit: Payer: Self-pay

## 2020-11-21 ENCOUNTER — Encounter (HOSPITAL_COMMUNITY): Payer: Self-pay | Admitting: Surgery

## 2020-11-21 LAB — BASIC METABOLIC PANEL
Anion gap: 6 (ref 5–15)
BUN: 18 mg/dL (ref 6–20)
CO2: 22 mmol/L (ref 22–32)
Calcium: 9.1 mg/dL (ref 8.9–10.3)
Chloride: 108 mmol/L (ref 98–111)
Creatinine, Ser: 1.29 mg/dL — ABNORMAL HIGH (ref 0.61–1.24)
GFR, Estimated: >60 mL/min (ref >60)
Glucose, Bld: 126 mg/dL — ABNORMAL HIGH (ref 70–99)
Potassium: 4.8 mmol/L (ref 3.5–5.1)
Sodium: 136 mmol/L (ref 135–145)

## 2020-11-21 LAB — CBC
HCT: 37 % — ABNORMAL LOW (ref 39.0–52.0)
Hemoglobin: 12.5 g/dL — ABNORMAL LOW (ref 13.0–17.0)
MCH: 31 pg (ref 26.0–34.0)
MCHC: 33.8 g/dL (ref 30.0–36.0)
MCV: 91.8 fL (ref 80.0–100.0)
Platelets: 251 10*3/uL (ref 150–400)
RBC: 4.03 MIL/uL — ABNORMAL LOW (ref 4.22–5.81)
RDW: 13.3 % (ref 11.5–15.5)
WBC: 20.8 10*3/uL — ABNORMAL HIGH (ref 4.0–10.5)
nRBC: 0 % (ref 0.0–0.2)

## 2020-11-21 MED ORDER — ZINC OXIDE 11.3 % EX CREA
TOPICAL_CREAM | Freq: Four times a day (QID) | CUTANEOUS | Status: DC
  Filled 2020-11-21: qty 56

## 2020-11-21 MED ORDER — TRAMADOL HCL 50 MG PO TABS: 50.0000 mg | ORAL_TABLET | Freq: Four times a day (QID) | ORAL | 0 refills | Status: AC | PRN

## 2020-11-21 NOTE — Progress Notes (Signed)
Patient progressing very well post-op, walking in the room and hallway independently, denies the need for pain medication beyond scheduled tylenol, is voiding and has had a few small BM's, abdominal dressing remains CDI, he is tolerating clears, diet advanced to full liquids for breakfast, vss, will continue to monitor.

## 2020-11-21 NOTE — Discharge Instructions (Signed)
POST OP INSTRUCTIONS  DIET: Be sure to include lots of fluids daily to stay hydrated - 64oz of water per day (8, 8 oz glasses).  Avoid fast food or heavy meals for the first couple of weeks as your are more likely to get nauseated. Avoid raw/uncooked fruits or vegetables for the first 4 weeks (its ok to have these if they are blended into smoothie form). If you have fruits/vegetables, make sure they are cooked until soft enough to mash on the roof of your mouth and chew your food well. Otherwise, diet as tolerated.  Take your usually prescribed home medications unless otherwise directed.  PAIN CONTROL: Pain is best controlled by a usual combination of three different methods TOGETHER: Ice/Heat Over the counter pain medication Prescription pain medication Most patients will experience some swelling and bruising around the surgical site.  Ice packs or heating pads (30-60 minutes up to 6 times a day) will help. Some people prefer to use ice alone, heat alone, alternating between ice & heat.  Experiment to what works for you.  Swelling and bruising can take several weeks to resolve.   It is helpful to take an over-the-counter pain medication regularly for the first few weeks: Ibuprofen (Motrin/Advil) - '200mg'$  tabs - take 3 tabs ('600mg'$ ) every 6 hours as needed for pain (unless you have been directed previously to avoid NSAIDs/ibuprofen) Acetaminophen (Tylenol) - you may take '650mg'$  every 6 hours as needed. You can take this with motrin as they act differently on the body. If you are taking a narcotic pain medication that has acetaminophen in it, do not take over the counter tylenol at the same time. NOTE: You may take both of these medications together - most patients  find it most helpful when alternating between the two (i.e. Ibuprofen at 6am, tylenol at 9am, ibuprofen at 12pm ..Marland Kitchen) A  prescription for pain medication should be given to you upon discharge.  Take your pain medication as prescribed if your  pain is not adequatly controlled with the over-the-counter pain reliefs mentioned above.  Wound care: Cover your prior ileostomy site with dry gauze, change 1-2x/day and as needed. The purpose is to protect clothing from drainage. Ok to shower with soap/water over your wounds. This should close in the next couple of weeks and does not need to be 'packed' at this time.  Place diaper rash cream 2-4x/day around the anal skin - Calmoseptine, Boudreau's Butt Paste, etc - the zinc oxide acts as a water barrier and will help protect your skin  ACTIVITIES as tolerated:   Avoid heavy lifting (>10lbs or 1 gallon of milk) for the next 6 weeks. You may resume regular daily activities as tolerated--such as daily self-care, walking, climbing stairs--gradually increasing activities as tolerated.  If you can walk 30 minutes without difficulty, it is safe to try more intense activity such as jogging, treadmill, bicycling, low-impact aerobics.  DO NOT PUSH THROUGH PAIN.  Let pain be your guide: If it hurts to do something, don't do it. You may drive when you are no longer taking prescription pain medication, you can comfortably wear a seatbelt, and you can safely maneuver your car and apply brakes.  FOLLOW UP in our office Please call [facility] at [phone] to set up an appointment to see your surgeon in the office for a follow-up appointment approximately 2 weeks after your surgery. Make sure that you call for this appointment the day you arrive home to insure a convenient appointment time.  9.  If you have disability or family leave forms that need to be completed, you may have them completed by your primary care physician's office; for return to work instructions, please ask our office staff and they will be happy to assist you in obtaining this documentation   When to call us [phone]: Poor pain control Reactions / problems with new medications (rash/itching, etc)  Fever over 101.5 F (38.5 C) Inability  to urinate Nausea/vomiting Worsening swelling or bruising Continued bleeding from incision. Increased pain, redness, or drainage from the incision  The clinic staff is available to answer your questions during regular business hours (8:30am-5pm).  Please don't hesitate to call and ask to speak to one of our nurses for clinical concerns.   A surgeon from [facility] is always on call at the hospitals   If you have a medical emergency, go to the nearest emergency room or call 911.  [facility], [place] [address], [place], [place], [place]  [address] MAIN: [phone] FAX: [phone] [email or web address]

## 2020-11-21 NOTE — Progress Notes (Signed)
0830- Pt sitting in chair watching tv. No complaints voiced. No distress noted. Lungs clear bilaterally. Abdomen soft, appropriately tender at operative site.  Large, bulky, midline dsg with ABD pads. Small area of drainage shadowing at bottom left corner of dsg. Bowel sounds hypoactive x4 quadrants. Pt denies pain. Call bell within reach.  1115- Medications administered and pt tolerated well. Diet advanced and delivered by Nutrition Services at this time.  1230- Tolerated advanced diet well. Denies pain and nausea. Reports soreness to entire abdomen. Declines pain medication at this time.  1330- Operative dsg removed by Dr. Dema Severin. Operative site covered with gauze and medipore tape by this nurse. Pt denies pain, nausea. Call bell within reach.  1800- Has ambulated several times. Pt sitting in chair in nad. Call bell within reach.

## 2020-11-21 NOTE — Progress Notes (Signed)
  Subjective No acute events. He is doing well. No n/v. Tolerating full liquids.  Objective: Vital signs in last 24 hours: Temp:  [97.7 F (36.5 C)-98.7 F (37.1 C)] 98.4 F (36.9 C) (08/20 0924) Pulse Rate:  [58-77] 72 (08/20 0924) Resp:  [14-17] 16 (08/20 0545) BP: (104-119)/(54-83) 107/73 (08/20 0924) SpO2:  [94 %-99 %] 97 % (08/20 0924) Weight:  [96.2 kg] 96.2 kg (08/20 0100) Last BM Date:  (S/P ostomy takedown)  Intake/Output from previous day: 08/19 0701 - 08/20 0700 In: 3225.2 [P.O.:720; I.V.:2405.2; IV Piggyback:100] Out: 10 [Blood:10] Intake/Output this shift: Total I/O In: 360 [P.O.:360] Out: -   Gen: NAD, comfortable CV: RRR Pulm: Normal work of breathing Abd: Soft, NT/ND. Ileostomy takedown site is clean. Ext: SCDs in place  Lab Results: CBC  Recent Labs    11/21/20 0434  WBC 20.8*  HGB 12.5*  HCT 37.0*  PLT 251   BMET Recent Labs    11/21/20 0434  NA 136  K 4.8  CL 108  CO2 22  GLUCOSE 126*  BUN 18  CREATININE 1.29*  CALCIUM 9.1   PT/INR No results for input(s): LABPROT, INR in the last 72 hours. ABG No results for input(s): PHART, HCO3 in the last 72 hours.  Invalid input(s): PCO2, PO2  Studies/Results:  Anti-infectives: Anti-infectives (From admission, onward)    Start     Dose/Rate Route Frequency Ordered Stop   11/20/20 0600  cefoTEtan (CEFOTAN) 2 g in sodium chloride 0.9 % 100 mL IVPB        2 g 200 mL/hr over 30 Minutes Intravenous On call to O.R. 11/20/20 0529 11/20/20 0815        Assessment/Plan: Patient Active Problem List   Diagnosis Date Noted   S/P closure of ileostomy 11/20/2020   Genetic testing 09/01/2020   Colon cancer ([place]) 08/12/2020   Family history of breast cancer    Family history of prostate cancer    S/P total colectomy 07/24/2020   Essential hypertension 04/08/2020   Abnormal EKG 01/06/2020   Mixed hyperlipidemia 01/06/2020   s/p Procedure(s): TAKEDOWN LOOP ILEOSTOMY 11/20/2020  -Tolerating  full liquids -Advance to soft, continue BID metamucil - should help thicken -Zinc oxide to perianal skin to prevent diaper rash -Ambulate as he is -Cont MIVF today   LOS: 1 day   Nadeen Landau, MD [facility] Use [email or web address] to contact on call provider

## 2020-11-21 NOTE — Plan of Care (Signed)
  Problem: Pain Managment: Goal: General experience of comfort will improve Outcome: Progressing   Problem: Safety: Goal: Ability to remain free from injury will improve Outcome: Progressing   Problem: Coping: Goal: Level of anxiety will decrease Outcome: Progressing   

## 2020-11-22 MED ORDER — LOPERAMIDE HCL 2 MG PO CAPS: 2.0000 mg | ORAL_CAPSULE | Freq: Three times a day (TID) | ORAL | Status: DC

## 2020-11-22 MED ORDER — LOPERAMIDE HCL 2 MG PO CAPS: 2.0000 mg | ORAL_CAPSULE | Freq: Three times a day (TID) | ORAL | 1 refills | Status: DC

## 2020-11-22 NOTE — Discharge Summary (Signed)
Patient ID: Don Meyer MRN: [id number] DOB/AGE: 04-Feb-1976 45 y.o.  Admit date: 11/20/2020 Discharge date: 11/22/2020  Discharge Diagnoses Patient Active Problem List   Diagnosis Date Noted   S/P closure of ileostomy 11/20/2020   Genetic testing 09/01/2020   Colon cancer ([place]) 08/12/2020   Family history of breast cancer    Family history of prostate cancer    S/P total colectomy 07/24/2020   Essential hypertension 04/08/2020   Abnormal EKG 01/06/2020   Mixed hyperlipidemia 01/06/2020   Procedures Takedown of loop ileostomy 11/20/20   Hospital Course: He was admitted postoperatively where he recovered well. His diet was advanced and he began having regular reliable bowel fxn. He was started on BID metamucil and on 8/21, we added TID 2 mg Imodium to help thicken things. He was instructed on postoperative expectations and things to watch out for. We have given him Balmex cream to go home with, discussed imodium regimen, BID fiber. If stools not appropriately thickening to more toothpaste consistency to please let us know. Monitor hydration.    Allergies as of 11/22/2020   No Known Allergies      Medication List     TAKE these medications    loperamide 2 MG capsule Commonly known as: IMODIUM Take 1 capsule (2 mg total) by mouth 3 (three) times daily.   traMADol 50 MG tablet Commonly known as: Ultram Take 1 tablet (50 mg total) by mouth every 6 (six) hours as needed for up to 5 days (postop pain not controlled with tylenol and ibuprofen first).          Follow-up Information     Ileana Roup, MD. Schedule an appointment as soon as possible for a visit in 2 week(s).   Specialties: General Surgery, Colon and Rectal Surgery Why: 2-3 weeks for postop appointment, For wound re-check Contact information: [place] [address] [phone]                 Lakeesha Fontanilla M. Dema Severin, M.D. [facility]

## 2020-11-22 NOTE — Progress Notes (Signed)
  Subjective No acute events. He is doing well. No n/v. Tolerating soft diet without n/v. Still rather liquids bms.  Objective: Vital signs in last 24 hours: Temp:  [97.4 F (36.3 C)-98.8 F (37.1 C)] 98.8 F (37.1 C) (08/21 0609) Pulse Rate:  [55-72] 55 (08/21 0609) Resp:  [17-18] 17 (08/21 0609) BP: (105-113)/(60-75) 105/75 (08/21 0609) SpO2:  [96 %-98 %] 96 % (08/21 0609) Last BM Date: 11/21/20  Intake/Output from previous day: 08/20 0701 - 08/21 0700 In: 1207 [P.O.:1190; I.V.:17] Out: 0  Intake/Output this shift: No intake/output data recorded.  Gen: NAD, comfortable CV: RRR Pulm: Normal work of breathing Abd: Soft, NT/ND. Ileostomy takedown site is clean. Ext: SCDs in place  Lab Results: CBC  Recent Labs    11/21/20 0434  WBC 20.8*  HGB 12.5*  HCT 37.0*  PLT 251   BMET Recent Labs    11/21/20 0434  NA 136  K 4.8  CL 108  CO2 22  GLUCOSE 126*  BUN 18  CREATININE 1.29*  CALCIUM 9.1   PT/INR No results for input(s): LABPROT, INR in the last 72 hours. ABG No results for input(s): PHART, HCO3 in the last 72 hours.  Invalid input(s): PCO2, PO2  Studies/Results:  Anti-infectives: Anti-infectives (From admission, onward)    Start     Dose/Rate Route Frequency Ordered Stop   11/20/20 0600  cefoTEtan (CEFOTAN) 2 g in sodium chloride 0.9 % 100 mL IVPB        2 g 200 mL/hr over 30 Minutes Intravenous On call to O.R. 11/20/20 0529 11/20/20 0815        Assessment/Plan: Patient Active Problem List   Diagnosis Date Noted   S/P closure of ileostomy 11/20/2020   Genetic testing 09/01/2020   Colon cancer ([place]) 08/12/2020   Family history of breast cancer    Family history of prostate cancer    S/P total colectomy 07/24/2020   Essential hypertension 04/08/2020   Abnormal EKG 01/06/2020   Mixed hyperlipidemia 01/06/2020   s/p Procedure(s): TAKEDOWN LOOP ILEOSTOMY 11/20/2020  -Tolerating soft diet -Continue BID metamucil - added imodium 2 mg  TID -Zinc oxide to perianal skin to prevent diaper rash -Ambulate as he is -He is comfortable with and stable for discharge home today   LOS: 2 days   Nadeen Landau, MD [facility] Use [email or web address] to contact on call provider

## 2020-11-22 NOTE — Progress Notes (Signed)
Discharge instructions given to patient and all questions were answered.  

## 2020-11-23 LAB — SURGICAL PATHOLOGY

## 2020-11-24 LAB — BPAM RBC
Blood Product Expiration Date: 202209212359
Blood Product Expiration Date: 202209212359
Unit Type and Rh: 6200
Unit Type and Rh: 6200

## 2020-11-24 LAB — TYPE AND SCREEN
ABO/RH(D): AB POS
Antibody Screen: POSITIVE
DAT, IgG: POSITIVE
Unit division: 0
Unit division: 0

## 2021-01-23 ENCOUNTER — Other Ambulatory Visit: Payer: Self-pay | Admitting: Cardiology

## 2021-01-23 DIAGNOSIS — E782 Mixed hyperlipidemia: Secondary | ICD-10-CM

## 2021-05-12 ENCOUNTER — Encounter: Payer: Self-pay | Admitting: Cardiology

## 2021-05-12 ENCOUNTER — Other Ambulatory Visit: Payer: Self-pay

## 2021-05-12 ENCOUNTER — Ambulatory Visit: Payer: 59 | Admitting: Cardiology

## 2021-05-12 VITALS — BP 155/92 | HR 64 | Temp 97.6°F | Resp 16 | Ht 68.0 in | Wt 224.0 lb

## 2021-05-12 DIAGNOSIS — I10 Essential (primary) hypertension: Secondary | ICD-10-CM

## 2021-05-12 DIAGNOSIS — E782 Mixed hyperlipidemia: Secondary | ICD-10-CM

## 2021-05-12 NOTE — Progress Notes (Signed)
Patient referred by Deland Pretty, MD for abnormal EKG  Subjective:   Don Meyer, male    DOB: Sep 06, 1975, 46 y.o.   MRN: [id number]   Chief Complaint  Patient presents with   Hypertension   Hyperlipidemia   Follow-up     HPI  47 y.o. Caucasian male with hypertension, hyperlipidemia  Since his last visit with me, patient was diagnosed with colon cancer. He underwent total colectomy, followed by ileostomy and subsequent closure. He has recovered well. In the process., he wan out his losartan-HCTZ and Crestor. Blood pressure was controlled on this, now uncontrolled.  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 He has had untreated hypertension and hyperlipidemia for quite some time. He has now decided to take care of his health issues.  He has establish care with a PCP and was recently started on losartan 25 mg and 10 mg daily.  Is started walking treadmill for 2 miles 4 days a week.  He admits to eating fast food in the past, but is trying to make positive changes to his diet as well.  He denies any chest pain shortness of breath, orthopnea, PND symptoms.    Current Outpatient Medications on File Prior to Visit  Medication Sig Dispense Refill   loperamide (IMODIUM) 2 MG capsule Take 1 capsule (2 mg total) by mouth 3 (three) times daily. 90 capsule 1   No current facility-administered medications on file prior to visit.    Cardiovascular and other pertinent studies:  EKG 01/06/2020: Sinus rhythm 93 bpm Nonspecific T wave chages  EKG 12/19/2019: Sinus rhythm 66 bpm. Inferolateral T wave inversion, consider ischemia   Recent labs: 11/21/2020: Glucose 126, BUN/Cr 18/1.29. EGFR >60. Na/K 136/4.8. Rest of the CMP normal H/H 12/37. MCV 91. Platelets 251  04/06/2020: Glucose 90, BUN/Cr 11/0.91. EGFR normal. Na/K 144/3.8. Chol 137, TG 249, HDL 23, LDL 73  12/23/2019: Glucose 84, BUN/Cr 11/?. EGFR 97. Na/K  145/4.8. ALT: 84 H/H 15.7/46.6. MCV 88.9. Platelets 249 Chol 244, TG 183, HDL 26, LDL 181   Review of Systems  Cardiovascular:  Negative for chest pain, dyspnea on exertion, leg swelling, palpitations and syncope.        Vitals:   05/12/21 0847  BP: (!) 149/97  Pulse: 62  Resp: 16  Temp: 97.6 F (36.4 C)  SpO2: 97%     Body mass index is 34.06 kg/m. Filed Weights   05/12/21 0847  Weight: 224 lb (101.6 kg)     Objective:   Physical Exam Vitals and nursing note reviewed.  Constitutional:      General: He is not in acute distress. Neck:     Vascular: No JVD.  Cardiovascular:     Rate and Rhythm: Normal rate and regular rhythm.     Heart sounds: Normal heart sounds. No murmur heard. Pulmonary:     Effort: Pulmonary effort is normal.     Breath sounds: Normal breath sounds. No wheezing or rales.        Assessment & Recommendations:   46 y.o. Caucasian male with hypertension, hyperlipidemia, h/o colon cancer  Hypertension: Uncontrolled. Check BMP today. If Cr favorable, will resume losartan-HCTZ 50-12.5 mg daily and then repeat another BMP in 1 week  Mixed hyperlipidemia: He has been off Crestor. Check lipid panel and then resume Cresto accordingly. Will repeat another lipid panel in 3 months   F/u in 3 months   Harley Mccartney Esther Hardy, MD Pager:  [phone] Office: [phone]

## 2021-05-13 ENCOUNTER — Other Ambulatory Visit: Payer: Self-pay | Admitting: Cardiology

## 2021-05-13 DIAGNOSIS — I10 Essential (primary) hypertension: Secondary | ICD-10-CM

## 2021-05-13 DIAGNOSIS — E782 Mixed hyperlipidemia: Secondary | ICD-10-CM

## 2021-05-13 LAB — BASIC METABOLIC PANEL
BUN/Creatinine Ratio: 11 (ref 9–20)
BUN: 12 mg/dL (ref 6–24)
CO2: 22 mmol/L (ref 20–29)
Calcium: 9.7 mg/dL (ref 8.7–10.2)
Chloride: 102 mmol/L (ref 96–106)
Creatinine, Ser: 1.13 mg/dL (ref 0.76–1.27)
Glucose: 76 mg/dL (ref 70–99)
Potassium: 4.2 mmol/L (ref 3.5–5.2)
Sodium: 141 mmol/L (ref 134–144)
eGFR: 82 mL/min/{1.73_m2} (ref >59)

## 2021-05-13 LAB — LIPID PANEL
Chol/HDL Ratio: 7.8 ratio — ABNORMAL HIGH (ref 0.0–5.0)
Cholesterol, Total: 196 mg/dL (ref 100–199)
HDL: 25 mg/dL — ABNORMAL LOW (ref >39)
LDL Chol Calc (NIH): 140 mg/dL — ABNORMAL HIGH (ref 0–99)
Triglycerides: 168 mg/dL — ABNORMAL HIGH (ref 0–149)
VLDL Cholesterol Cal: 31 mg/dL (ref 5–40)

## 2021-05-13 MED ORDER — ROSUVASTATIN CALCIUM 20 MG PO TABS: 20.0000 mg | ORAL_TABLET | Freq: Every day | ORAL | 3 refills | Status: DC

## 2021-05-13 MED ORDER — LOSARTAN POTASSIUM-HCTZ 50-12.5 MG PO TABS: 1.0000 | ORAL_TABLET | Freq: Every day | ORAL | 3 refills | Status: DC

## 2021-06-02 DIAGNOSIS — K51018 Ulcerative (chronic) pancolitis with other complication: Secondary | ICD-10-CM | POA: Diagnosis not present

## 2021-06-21 DIAGNOSIS — K5190 Ulcerative colitis, unspecified, without complications: Secondary | ICD-10-CM | POA: Diagnosis not present

## 2021-07-27 DIAGNOSIS — K5120 Ulcerative (chronic) proctitis without complications: Secondary | ICD-10-CM | POA: Diagnosis not present

## 2021-07-27 DIAGNOSIS — K5190 Ulcerative colitis, unspecified, without complications: Secondary | ICD-10-CM | POA: Diagnosis not present

## 2021-07-27 DIAGNOSIS — K5100 Ulcerative (chronic) pancolitis without complications: Secondary | ICD-10-CM | POA: Diagnosis not present

## 2021-08-09 ENCOUNTER — Ambulatory Visit: Payer: 59 | Admitting: Cardiology

## 2021-08-16 ENCOUNTER — Encounter: Payer: Self-pay | Admitting: Student

## 2021-08-16 ENCOUNTER — Ambulatory Visit: Payer: 59 | Admitting: Student

## 2021-08-16 VITALS — BP 114/77 | HR 83 | Temp 98.1°F | Resp 16 | Ht 68.0 in | Wt 230.0 lb

## 2021-08-16 DIAGNOSIS — I10 Essential (primary) hypertension: Secondary | ICD-10-CM

## 2021-08-16 DIAGNOSIS — E782 Mixed hyperlipidemia: Secondary | ICD-10-CM

## 2021-08-16 DIAGNOSIS — K5190 Ulcerative colitis, unspecified, without complications: Secondary | ICD-10-CM | POA: Diagnosis not present

## 2021-08-16 NOTE — Progress Notes (Signed)
? ? ?Patient referred by Deland Pretty, MD for abnormal EKG ? ?Subjective:  ? ?Don Meyer, male    DOB: 05-Jan-1976, 46 y.o.   MRN: [id number] ? ? ?Chief Complaint  ?Patient presents with  ? Hypertension  ? Follow-up  ?  3 month  ? ? ? ?HPI ? ?46 y.o. Caucasian male with hypertension, hyperlipidemia.  Patient does have history of colon cancer for which he underwent total colectomy followed by ileostomy and subsequent closure. ? ?Patient was last seen in our office 05/12/2021 by Dr. Virgina Jock at which point given uncontrolled hypertension resumed losartan/HCTZ unfortunately repeat BMP has not been done.  Patient at that office visit had also inadvertently discontinued Crestor, which was resumed, but lipid profile testing has not been done since. ? ?Presents for 45-monthfollow-up.  He is feeling well overall without specific complaints today.  He is tolerating resumption of Crestor and losartan/hydrochlorothiazide without issue.  Denies dyspnea, chest pain.  Blood pressure is now well controlled. ? ? ?Current Outpatient Medications on File Prior to Visit  ?Medication Sig Dispense Refill  ? losartan-hydrochlorothiazide (HYZAAR) 50-12.5 MG tablet Take 1 tablet by mouth daily. 90 tablet 3  ? rosuvastatin (CRESTOR) 20 MG tablet Take 1 tablet (20 mg total) by mouth daily. 90 tablet 3  ? ?No current facility-administered medications on file prior to visit.  ? ? ?Cardiovascular and other pertinent studies: ?EKG 08/16/2021:  ?Normal sinus rhythm at a rate of 69 bpm.  Normal axis.  No evidence of ischemia or underlying injury pattern.  Nonspecific T wave changes, unchanged compared to previous EKG. ? ?EKG 12/19/2019: ?Sinus rhythm 66 bpm. ?Inferolateral T wave inversion, consider ischemia ? ? ?Recent labs: ? ?  Latest Ref Rng & Units 05/12/2021  ?  9:24 AM 11/21/2020  ?  4:34 AM 11/02/2020  ?  8:20 AM  ?CMP  ?Glucose 70 - 99 mg/dL 76   126   79    ?BUN 6 - 24 mg/dL 12   18   19     ?Creatinine 0.76 - 1.27 mg/dL 1.13   1.29   1.14     ?Sodium 134 - 144 mmol/L 141   136   135    ?Potassium 3.5 - 5.2 mmol/L 4.2   4.8   4.4    ?Chloride 96 - 106 mmol/L 102   108   108    ?CO2 20 - 29 mmol/L 22   22   22     ?Calcium 8.7 - 10.2 mg/dL 9.7   9.1   9.4    ? ? ?  Latest Ref Rng & Units 11/21/2020  ?  4:34 AM 11/02/2020  ?  8:20 AM 07/27/2020  ?  4:52 AM  ?CBC  ?WBC 4.0 - 10.5 K/uL 20.8   9.0   11.5    ?Hemoglobin 13.0 - 17.0 g/dL 12.5   13.4   15.3    ?Hematocrit 39.0 - 52.0 % 37.0   40.6   45.1    ?Platelets 150 - 400 K/uL 251   311   279    ? ?Lipid Panel  ?   ?Component Value Date/Time  ? CHOL 196 05/12/2021 0924  ? TRIG 168 (H) 05/12/2021 0924  ? HDL 25 (L) 05/12/2021 0924  ? CHOLHDL 7.8 (H) 05/12/2021 07482 ? L[place]140 (H) 05/12/2021 07078 ? ?HEMOGLOBIN A1C ?Lab Results  ?Component Value Date  ? HGBA1C 5.8 (H) 07/13/2020  ? MPG 119.76 07/13/2020  ? ?TSH ?No results for  input(s): TSH in the last 8760 hours. ? ?11/21/2020: ?Glucose 126, BUN/Cr 18/1.29. EGFR >60. Na/K 136/4.8. Rest of the CMP normal ?H/H 12/37. MCV 91. Platelets 251 ? ?04/06/2020: ?Glucose 90, BUN/Cr 11/0.91. EGFR normal. Na/K 144/3.8. ?Chol 137, TG 249, HDL 23, LDL 73 ? ?12/23/2019: ?Glucose 84, BUN/Cr 11/?. EGFR 97. Na/K 145/4.8. ALT: 84 ?H/H 15.7/46.6. MCV 88.9. Platelets 249 ?Chol 244, TG 183, HDL 26, LDL 181 ? ? ?Review of Systems  ?Cardiovascular:  Negative for chest pain, dyspnea on exertion, leg swelling, palpitations and syncope.  ? ?   ? ? ?Vitals:  ? 08/16/21 0909  ?BP: 114/77  ?Pulse: 83  ?Resp: 16  ?Temp: 98.1 ?F (36.7 ?C)  ?SpO2: 96%  ? ? ? ?Body mass index is 34.97 kg/m?. Danley Danker Weights  ? 08/16/21 0909  ?Weight: 230 lb (104.3 kg)  ? ? ? ?Objective:  ? Physical Exam ?Vitals and nursing note reviewed.  ?Constitutional:   ?   General: He is not in acute distress. ?Neck:  ?   Vascular: No JVD.  ?Cardiovascular:  ?   Rate and Rhythm: Normal rate and regular rhythm.  ?   Heart sounds: Normal heart sounds. No murmur heard. ?Pulmonary:  ?   Effort: Pulmonary effort is normal.  ?    Breath sounds: Normal breath sounds. No wheezing or rales.  ?Physical exam unchanged compared to previous office visit. ? ?   ? ?Assessment & Recommendations:  ? ?46 y.o. Caucasian male with hypertension, hyperlipidemia, h/o colon cancer ? ?Hypertension: ?Now well controlled ?Continue losartan/hydrochlorothiazide ?We will obtain BMP to follow-up on reinitiation of losartan/HCTZ ? ?Mixed hyperlipidemia: ?Continue Crestor 20 mg p.o. daily ?We will repeat lipid profile testing ? ?Patient is otherwise stable from a cardiovascular standpoint.  Follow-up in 6 months, sooner if needed, at which time if he is stable could consider as needed follow-up. ? ? ?Alethia Berthold, PA-C ?08/16/2021, 2:46 PM ?Office: [phone] ? ?

## 2021-08-16 NOTE — Progress Notes (Signed)
EKG

## 2021-08-17 LAB — BASIC METABOLIC PANEL
BUN/Creatinine Ratio: 14 (ref 9–20)
BUN: 13 mg/dL (ref 6–24)
CO2: 22 mmol/L (ref 20–29)
Calcium: 9.1 mg/dL (ref 8.7–10.2)
Chloride: 102 mmol/L (ref 96–106)
Creatinine, Ser: 0.95 mg/dL (ref 0.76–1.27)
Glucose: 85 mg/dL (ref 70–99)
Potassium: 4.2 mmol/L (ref 3.5–5.2)
Sodium: 139 mmol/L (ref 134–144)
eGFR: 101 mL/min/{1.73_m2} (ref >59)

## 2021-08-17 LAB — LIPID PANEL WITH LDL/HDL RATIO
Cholesterol, Total: 117 mg/dL (ref 100–199)
HDL: 28 mg/dL — ABNORMAL LOW (ref >39)
LDL Chol Calc (NIH): 64 mg/dL (ref 0–99)
LDL/HDL Ratio: 2.3 ratio (ref 0.0–3.6)
Triglycerides: 144 mg/dL (ref 0–149)
VLDL Cholesterol Cal: 25 mg/dL (ref 5–40)

## 2021-10-01 NOTE — Progress Notes (Signed)
Called pt no answer left a vm to return the call

## 2021-10-01 NOTE — Progress Notes (Signed)
Patient read results on MyChart.

## 2021-10-08 ENCOUNTER — Encounter (HOSPITAL_COMMUNITY): Payer: Self-pay | Admitting: Nurse Practitioner

## 2021-10-15 ENCOUNTER — Encounter (HOSPITAL_COMMUNITY): Payer: Self-pay | Admitting: Nurse Practitioner

## 2021-10-26 ENCOUNTER — Encounter (HOSPITAL_COMMUNITY): Payer: Self-pay | Admitting: Nurse Practitioner

## 2021-11-10 DIAGNOSIS — K51019 Ulcerative (chronic) pancolitis with unspecified complications: Secondary | ICD-10-CM | POA: Diagnosis not present

## 2022-01-05 ENCOUNTER — Encounter (HOSPITAL_COMMUNITY): Payer: Self-pay | Admitting: Surgery

## 2022-01-05 DIAGNOSIS — R0681 Apnea, not elsewhere classified: Secondary | ICD-10-CM | POA: Diagnosis not present

## 2022-01-06 DIAGNOSIS — R0681 Apnea, not elsewhere classified: Secondary | ICD-10-CM | POA: Diagnosis not present

## 2022-01-14 ENCOUNTER — Encounter (HOSPITAL_COMMUNITY): Payer: Self-pay | Admitting: Surgery

## 2022-01-14 ENCOUNTER — Encounter (HOSPITAL_COMMUNITY)
Admission: RE | Disposition: A | Discharge status: Home / Self Care | Payer: Self-pay | Source: Ambulatory Visit | Attending: Surgery

## 2022-01-14 ENCOUNTER — Other Ambulatory Visit: Payer: Self-pay

## 2022-01-14 ENCOUNTER — Ambulatory Visit (HOSPITAL_COMMUNITY)
Admission: RE | Admit: 2022-01-14 | Discharge: 2022-01-14 | Disposition: A | Discharge status: Home / Self Care | Payer: 59 | Source: Ambulatory Visit | Attending: Surgery | Admitting: Surgery

## 2022-01-14 DIAGNOSIS — K51018 Ulcerative (chronic) pancolitis with other complication: Secondary | ICD-10-CM | POA: Diagnosis not present

## 2022-01-14 DIAGNOSIS — K529 Noninfective gastroenteritis and colitis, unspecified: Secondary | ICD-10-CM | POA: Diagnosis not present

## 2022-01-14 DIAGNOSIS — Z09 Encounter for follow-up examination after completed treatment for conditions other than malignant neoplasm: Secondary | ICD-10-CM | POA: Insufficient documentation

## 2022-01-14 DIAGNOSIS — K5190 Ulcerative colitis, unspecified, without complications: Secondary | ICD-10-CM | POA: Insufficient documentation

## 2022-01-14 DIAGNOSIS — Z9049 Acquired absence of other specified parts of digestive tract: Secondary | ICD-10-CM | POA: Insufficient documentation

## 2022-01-14 HISTORY — PX: BIOPSY: SHX5522

## 2022-01-14 HISTORY — PX: POUCHOSCOPY: SHX6321

## 2022-01-14 SURGERY — ENDOSCOPY, POUCH, SMALL INTESTINE, DIAGNOSTIC

## 2022-01-14 NOTE — Discharge Instructions (Signed)
YOU HAD AN ENDOSCOPIC PROCEDURE TODAY: Refer to the procedure report and other information in the discharge instructions given to you for any specific questions about what was found during the examination. If this information does not answer your questions, please call [facility] office at [phone] to clarify.   YOU SHOULD EXPECT: Some feelings of bloating in the abdomen. Passage of more gas than usual. Walking can help get rid of the air that was put into your GI tract during the procedure and reduce the bloating. If you had a lower endoscopy (such as a colonoscopy or flexible sigmoidoscopy) you may notice spotting of blood in your stool or on the toilet paper. Some abdominal soreness may be present for a day or two, also.  DIET: Your first meal following the procedure should be a light meal and then it is ok to progress to your normal diet. A half-sandwich or bowl of soup is an example of a good first meal. Heavy or fried foods are harder to digest and may make you feel nauseous or bloated. Drink plenty of fluids but you should avoid alcoholic beverages for 24 hours. If you had a esophageal dilation, please see attached instructions for diet.    ACTIVITY: Your care partner should take you home directly after the procedure. You should plan to take it easy, moving slowly for the rest of the day. You can resume normal activity the day after the procedure however YOU SHOULD NOT DRIVE, use power tools, machinery or perform tasks that involve climbing or major physical exertion for 24 hours (because of the sedation medicines used during the test).   SYMPTOMS TO REPORT IMMEDIATELY: A general surgeon can be reached at any hour. Please call [phone]  for any of the following symptoms:  Following lower endoscopy (colonoscopy, flexible sigmoidoscopy) Excessive amounts of blood in the stool  Significant tenderness, worsening of abdominal pains  Swelling of the abdomen that is new, acute   Fever of 100 or higher   FOLLOW UP:  If any biopsies were taken you will be contacted by phone or by letter within the next 1-3 weeks. Call [phone]  if you have not heard about the biopsies in 3 weeks.  Please also call with any specific questions about appointments or follow up tests.  

## 2022-01-14 NOTE — Op Note (Signed)
[facility] Patient Name: Don Meyer Procedure Date: 01/14/2022 MRN: [id number] Attending MD: Ileana Roup MD, MD Date of Birth: 09/26/1975 CSN: [id number] Age: 46 Admit Type: Inpatient Procedure:                Pouchoscopy Indications:              History of total proctocolectomy, Post-operative                            assessment Providers:                Sharon Mt. Teon Hudnall MD, MD, Carlyn Reichert, RN,                            William Dalton, Technician Referring MD:              Medicines:                None Complications:            No immediate complications. Estimated Blood Loss:     Estimated blood loss was minimal. Estimated blood                            loss was minimal. Procedure:                After obtaining informed consent, the endoscope was                            passed under direct vision. Throughout the                            procedure, the patient's blood pressure, pulse, and                            oxygen saturations were monitored continuously. The                            [id number]-[id number] ([id number] Olympus endoscope was introduced                            through the ileoanal anastomosis via the anus and                            advanced to the ileoanal pouch and into the                            neo-terminal ileum. The procedure was performed                            without difficulty. The patient tolerated the                            procedure well. The quality of the bowel                            preparation was adequate. Scope In: 11:02:32 AM  Scope Out: 11:09:33 AM Total Procedure Duration: 0 hours 7 minutes 1 second  Findings:      Patient is status-post restorative proctocolectomy with ileal J pouch       with an ileal pouch-anal anastomosis.      The perianal and digital rectal examinations were normal. Pertinent       negatives include normal sphincter tone.      Biopsies were taken with a cold  forceps Anorectal cuff for histology.       Estimated blood loss was minimal. Impression:               - Biopsies were taken with a cold forceps for                            histology Anorectal cuff. Recommendation:           - Repeat post-surgical lower GI endoscopy in 2                            years for surveillance. Procedure Code(s):        --- Professional ---                           [phone], Endoscopic evaluation of small intestinal                            pouch (eg, Kock pouch, ileal reservoir [S or J]);                            with biopsy, single or multiple Diagnosis Code(s):        --- Professional ---                           Z90.49, Acquired absence of other specified parts                            of digestive tract                           Z09, Encounter for follow-up examination after                            completed treatment for conditions other than                            malignant neoplasm CPT copyright 2019 [employer]. All rights reserved. The codes documented in this report are preliminary and upon coder review may  be revised to meet current compliance requirements. Nadeen Landau, MD Ileana Roup MD, MD 01/14/2022 11:18:39 AM This report has been signed electronically. Number of Addenda: 0

## 2022-01-14 NOTE — H&P (Signed)
CC: Here for diagnostic pouchoscopy  HPI: Don Meyer is an 46 y.o. male who reports a 20+ year history of ulcer colitis. He states his first diagnosed back in his early 90s. Approximately 15 years ago he had been maintained on what he believes to be in his mesalamine and occasional steroids but for the last 15 years has been on no treatment. He reports he had a colonoscopy in 2007 which I do not have a copy of. He underwent a colonoscopy with Dr. Benson Norway 05/21/20 that demonstrated moderately active pan colitis consistent with ulcerative colitis with random biopsies. A polyp was found in the sigmoid colon that appeared worrisome in its appearance and therefore was just biopsied. This returned with adenocarcinoma. The other biopsies throughout his colon were also in the same bottle but returned with chronic colitis, mildly active. No dysplasia or malignancy. He underwent staging CT 05/29/20 chest/abdomen/pelvis which showed 2 tiny indeterminate low attenuating lesions in the liver. Liver metastases cannot definitely be excluded and MRI recommended. No other worrisome findings. He is scheduled for an MRI 06/08/20.  Of note, on his colonoscopy, he does have rather diffuse pan colitis which was characterized as mild in appearance but notable throughout his entire colon. He has no history of anorectal abscesses or fistula. He has no history of recurring aphthous ulcers or anal fissures.  He reports he has 1-2 BMs per day at baseline, well formed stool without blood. Denies abdominal pain or bloating.   OR 07/24/20 - Robotic restorative proctocolectomy with ileal J-pouch, diverting loop ileostomy, pouchoscopy, bilateral tap blocks.  Path showed invasive adenocarcinoma well-differentiated spanning 2.1 cm. tumor is limited to the submucosa. Margins negative. All lymph nodes benign. Focal chronic mildly active colitis. Benign obliteration of the appendiceal tip. pT1N0M0. We reviewed his pathology at our  multidisciplinary tumor board and with the patient. Recommendations were made for genetic counseling by MDT and this was negative.  Pouchoscopy 09/22/20 shows healthy appearing J pouch without complicating features.   OR 11/20/20 - takedown of loop ileostomy  He was seen in follow-up- Doing reasonably well, function satisfactory. Does get up multiple times at night but has noted he eats dinner at 8-9pm. Recognizes if he ate earlier things may be better at night but this is the time the family gets together. Not currently taking a fiber supplement or Imodium. Has not tried taking Imodium before bed.  No blood in his stool. No abdominal pain. No nausea or vomiting. He has noted certain foods giving him more trouble with bloating and discomfort for a few hours after eating such as eggplant or other roughage/salads. We discussed trying to chew his food well and slow down at mealtime, healthy diet, and working towards weight loss if possible and rational therein  Dr. Benson Norway had sent me a staff message where he noted some inflammation near the ileal anal anastomosis. He was initially planning to start him on Humira but had reached out to [place] first.   He denies any changes in his health or health hx since we met in the office last. States he is ready for pouchoscopy.  Past Medical History:  Diagnosis Date   Colon cancer [facility])    Family history of breast cancer    Family history of prostate cancer    Hyperlipidemia    Hypertension    Ulcerative colitis [facility])     Past Surgical History:  Procedure Laterality Date   COLONOSCOPY     DIVERTING ILEOSTOMY N/A 07/24/2020   Procedure:  DIVERTING LOOP ILEOSTOMY;  Surgeon: Ileana Roup, MD;  Location: [facility];  Service: General;  Laterality: N/A;   ILEAL POUCH N/A 07/24/2020   Procedure: ILEAL J POUCH ANAL ANASTAMOSIS WITH POUCHOSCOPY;  Surgeon: Ileana Roup, MD;  Location: [facility];  Service: General;  Laterality: N/A;   ILEO LOOP COLOSTOMY CLOSURE  N/A 11/20/2020   Procedure: TAKEDOWN LOOP ILEOSTOMY;  Surgeon: Ileana Roup, MD;  Location: [facility];  Service: General;  Laterality: N/A;   POUCHOSCOPY N/A 09/22/2020   Procedure: POUCHOSCOPY;  Surgeon: Ileana Roup, MD;  Location: [facility];  Service: General;  Laterality: N/A;   WISDOM TOOTH EXTRACTION     XI ROBOTIC ASSISTED LOWER ANTERIOR RESECTION N/A 07/24/2020   Procedure: ROBOTIC RESTOREATIVE PROCTOCOLECTOMY AND BILATERAL TAP BLOCK;  Surgeon: Ileana Roup, MD;  Location: [facility];  Service: General;  Laterality: N/A;    Family History  Problem Relation Age of Onset   Heart disease Mother    COPD Father    Drug abuse Brother        d 33s   Irritable bowel syndrome Maternal Uncle    Prostate cancer Paternal Uncle        d. "young"   Lung cancer Maternal Grandmother        mets to liver   Alcohol abuse Maternal Grandfather    Cancer Paternal Grandfather        thought to be asbestos related   Breast cancer Other        dx in her 53s; MGM's sister   Prostate cancer Other        MGMs brother; d. in his 84s-40s    Social:  reports that he has never smoked. He has never used smokeless tobacco. He reports current alcohol use. He reports that he does not use drugs.  Allergies: No Known Allergies  Medications: I have reviewed the patient's current medications.  No results found for this or any previous visit (from the past 48 hour(s)).  No results found.   PE There were no vitals taken for this visit. Constitutional: NAD; conversant Eyes: Moist conjunctiva;  PERRL Lungs: Normal respiratory effort CV: RRR GI: Abd soft, NT/ND MSK: Normal range of motion of extremities Psychiatric: Appropriate affect  No results found for this or any previous visit (from the past 48 hour(s)).  No results found.   A/P: Don Meyer is an 46 y.o. male with hx of UC + T1N0M0 adenocarcinoma of colon, now s/p rPC with IPAA/DLI - s/p takedown of DLI  -We discussed  his function is generally fairly good. It ideally would be helpful for him to have dinner earlier in the evening as opposed at 8 or 9:00. That said, we discussed trialing 1 or 2 tablets of Imodium before bed to see if this helps with nighttime function. He generally has 6-7 bowel movements a day. No fever/chills/abdominal pain. No blood in his stool. Does not have any overt symptoms of pouchitis per se. -We discussed potentially performing a pouchoscopy ourselves and taking a look at everything and potentially biopsies if necessary. He would like to pursue this I think is reasonable given his symptoms and the questions brought up with Dr. Benson Norway.  -We spent time today reviewing pouchoscopy, material risks (including, but not limited to, bleeding, perforation, need for additional procedures, aspiration, pneumonia, heart attack, stroke, death) benefits and alternatives.  His questions were answered to his satisfaction, he expressed understanding of all the above and has elected to proceed.  Nadeen Landau, [facility], [place]

## 2022-01-15 ENCOUNTER — Encounter (HOSPITAL_COMMUNITY): Payer: Self-pay | Admitting: Surgery

## 2022-01-17 DIAGNOSIS — G4733 Obstructive sleep apnea (adult) (pediatric): Secondary | ICD-10-CM | POA: Diagnosis not present

## 2022-01-19 LAB — SURGICAL PATHOLOGY

## 2022-01-31 DIAGNOSIS — G4733 Obstructive sleep apnea (adult) (pediatric): Secondary | ICD-10-CM | POA: Diagnosis not present

## 2022-02-21 ENCOUNTER — Ambulatory Visit: Payer: 59 | Admitting: Student

## 2022-03-07 DIAGNOSIS — G4733 Obstructive sleep apnea (adult) (pediatric): Secondary | ICD-10-CM | POA: Diagnosis not present

## 2022-04-19 DIAGNOSIS — G4733 Obstructive sleep apnea (adult) (pediatric): Secondary | ICD-10-CM | POA: Diagnosis not present

## 2022-05-08 ENCOUNTER — Other Ambulatory Visit: Payer: Self-pay | Admitting: Cardiology

## 2022-05-08 DIAGNOSIS — I10 Essential (primary) hypertension: Secondary | ICD-10-CM

## 2022-05-08 DIAGNOSIS — E782 Mixed hyperlipidemia: Secondary | ICD-10-CM

## 2022-05-13 IMAGING — CT CT CHEST-ABD-PELV W/ CM
3 of 5 series · 15 of 36 positions shown, 17 images · IV contrast (Omnipaque)
Comparison: None.

CLINICAL DATA: Follow-up colon carcinoma.  Staging.

EXAM:
CT CHEST, ABDOMEN, AND PELVIS WITH CONTRAST
TECHNIQUE: Multidetector CT imaging of the chest, abdomen and pelvis was
performed following the standard protocol during bolus
administration of intravenous contrast.
CONTRAST:  100mL OMNIPAQUE IOHEXOL 300 MG/ML  SOLN

[Series 2: cap with 2 · axial · 0.76mm/px · z∈[-574,-44]mm · 9 of 134 slices shown, 11 images]
[im 14/134  mediastinal]
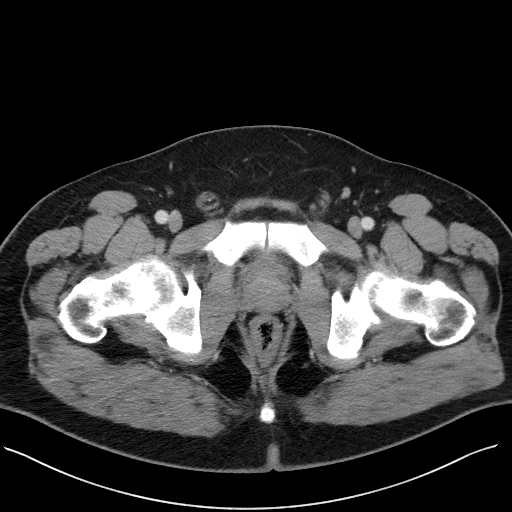
[im 14/134  bone]
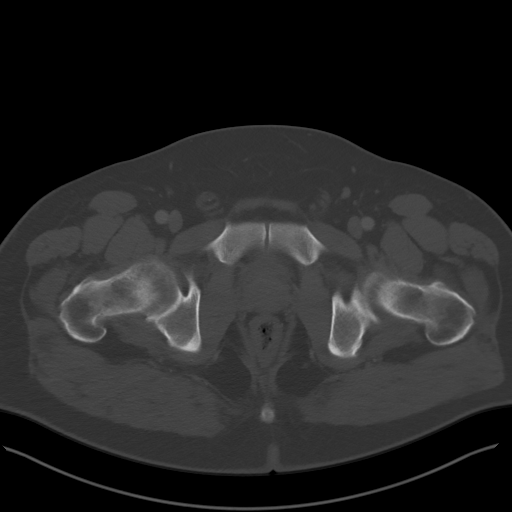
[im 27/134  mediastinal]
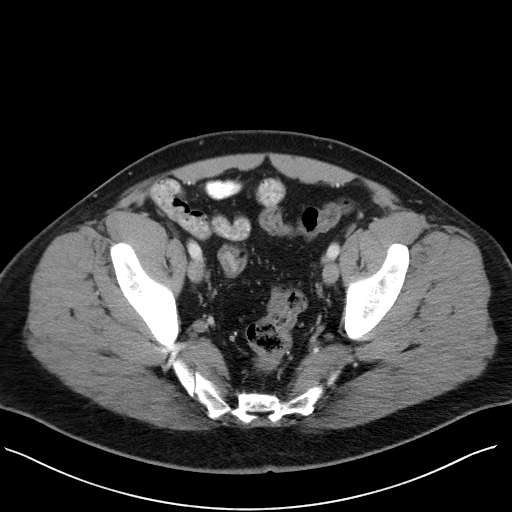
[im 40/134  mediastinal]
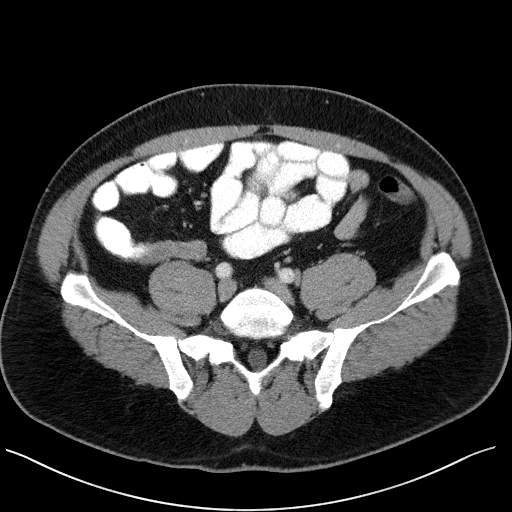
[im 54/134  mediastinal]
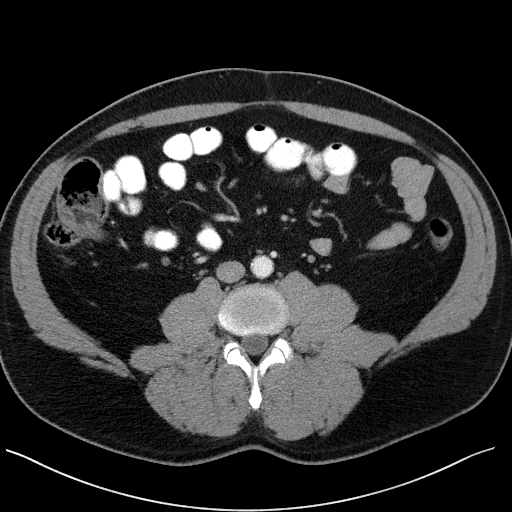
[im 67/134  mediastinal]
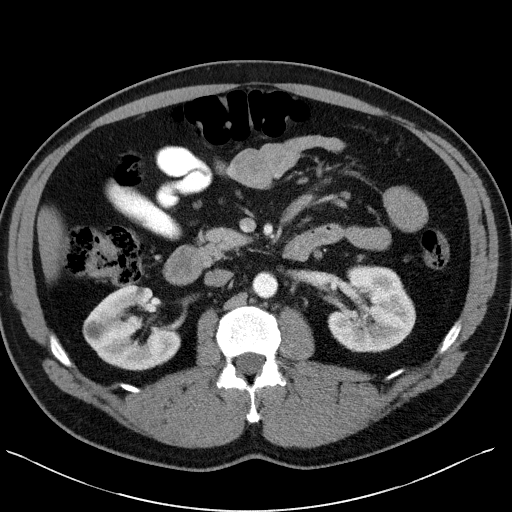
[im 80/134  mediastinal]
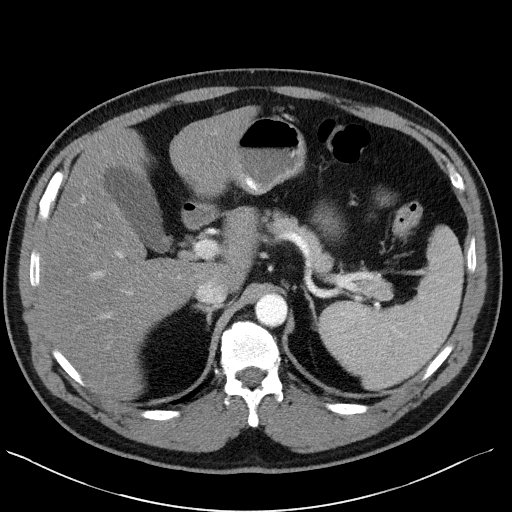
[im 94/134  mediastinal]
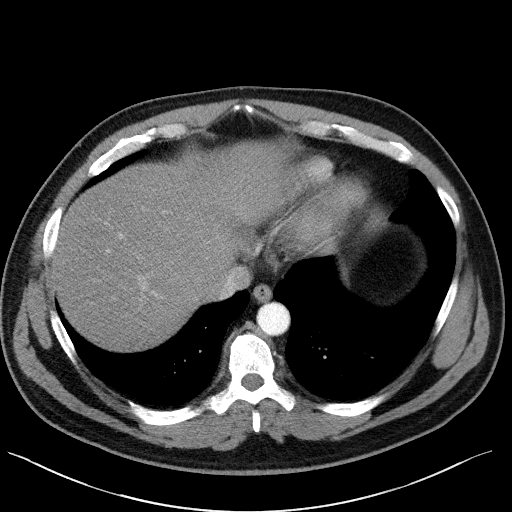
[im 107/134  mediastinal]
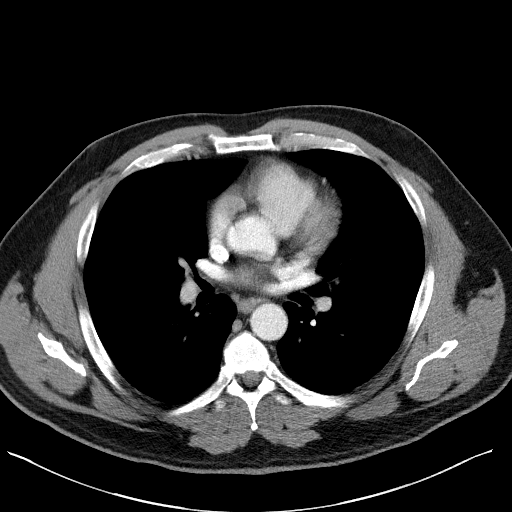
[im 120/134  mediastinal]
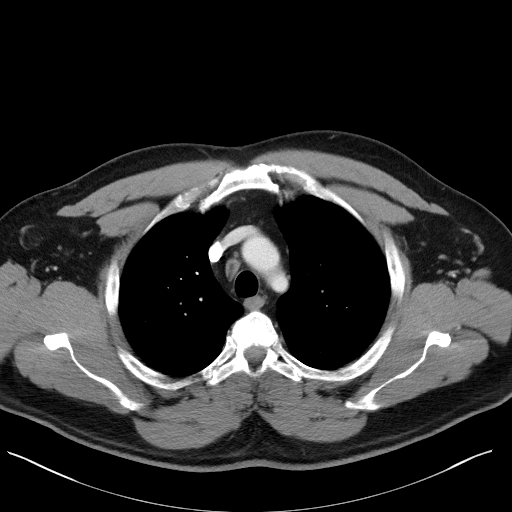
[im 120/134  bone]
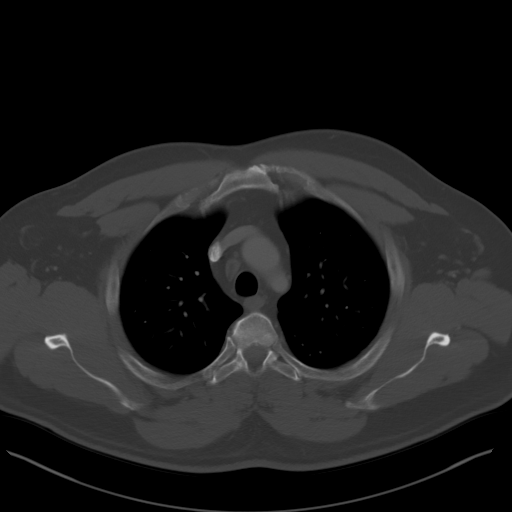

[Series 3: lung · axial · 0.76mm/px · z∈[-276,-200]mm · 3 of 164 slices shown]
[im 13/164  bone]
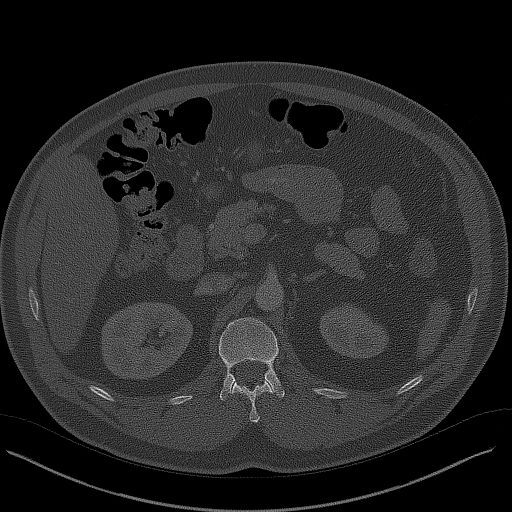
[im 38/164  bone]
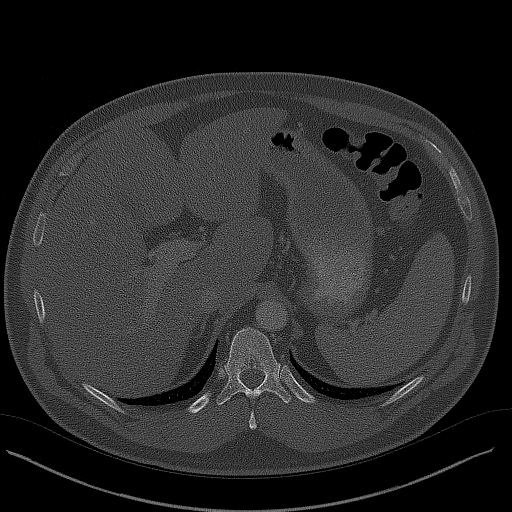
[im 51/164  bone]
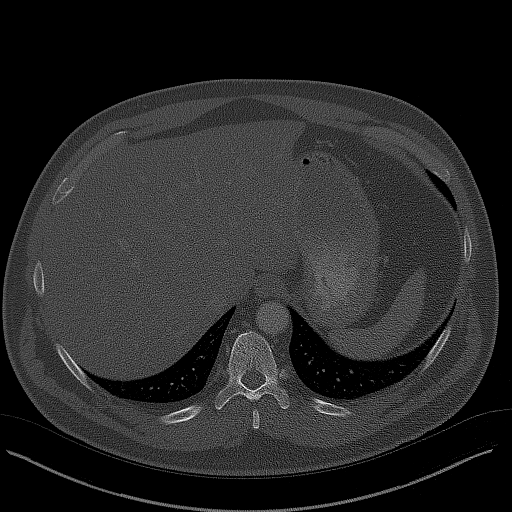

[Series 4: coronals · coronal · 0.80mm/px · 3 of 164 slices shown]
[im 33/164  mediastinal]
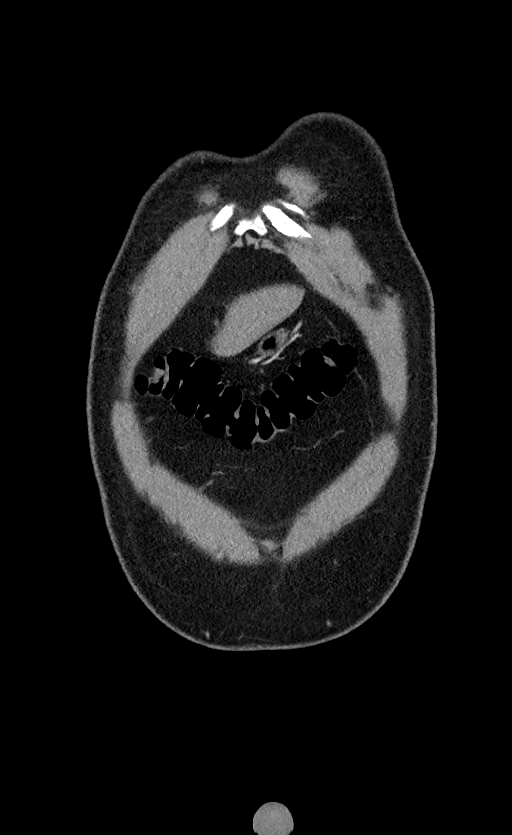
[im 66/164  mediastinal]
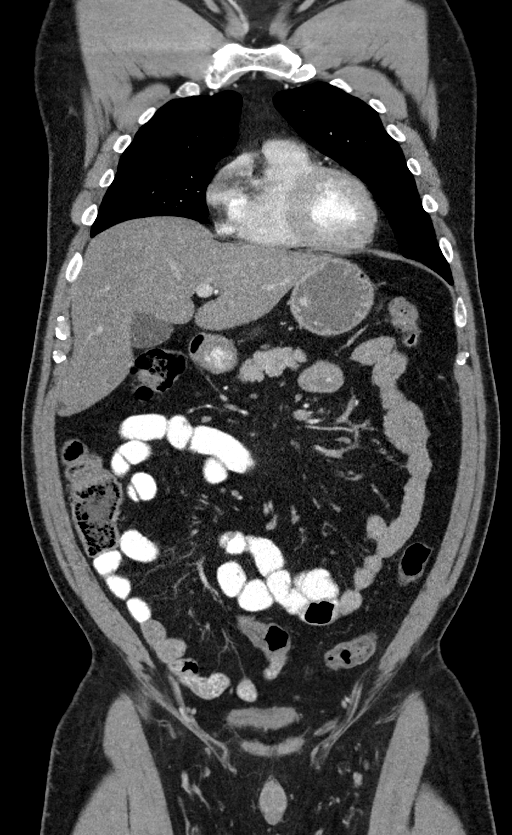
[im 98/164  mediastinal]
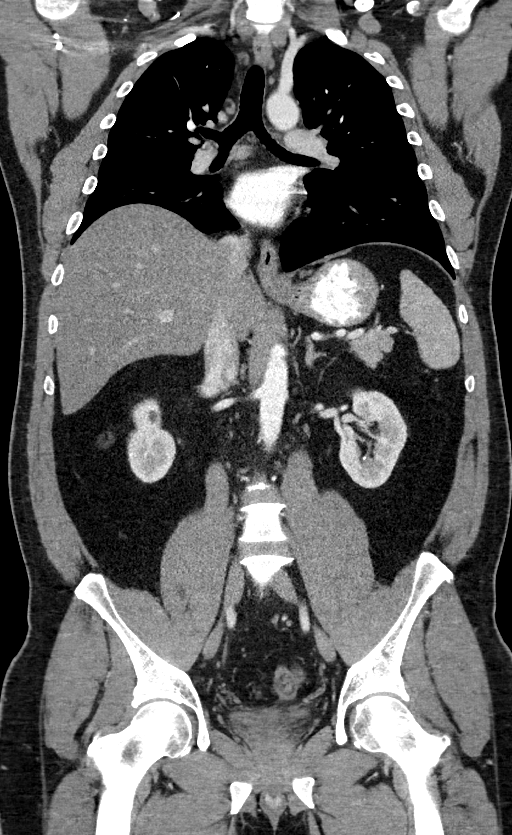

[15 of 36 positions shown; findings below may reference images not displayed]

FINDINGS: CT CHEST FINDINGS

Cardiovascular: No acute findings.

Mediastinum/Lymph Nodes: No masses or pathologically enlarged lymph
nodes identified.

Lungs/Pleura: No suspicious pulmonary nodules or masses identified.
No evidence of infiltrate or pleural effusion.

Musculoskeletal:  No suspicious bone lesions identified.

CT ABDOMEN AND PELVIS FINDINGS

Hepatobiliary: Moderate diffuse hepatic steatosis is seen. Two small
sub-cm low-attenuation lesions are seen in the anterior left and
inferior right hepatic lobes, which are too small to characterize.
Gallbladder is unremarkable. No evidence of biliary ductal
dilatation.

Pancreas:  No mass or inflammatory changes.

Spleen:  Within normal limits in size and appearance.

Adrenals/Urinary tract: No masses or hydronephrosis. Several small
cysts noted in upper pole of right kidney.

Stomach/Bowel: No evidence of obstruction, inflammatory process, or
abnormal fluid collections. Normal appendix visualized.

Vascular/Lymphatic: No pathologically enlarged lymph nodes
identified. No abdominal aortic aneurysm.

Reproductive:  No mass or other significant abnormality identified.

Other:  None.

Musculoskeletal:  No suspicious bone lesions identified.
IMPRESSION: Two tiny indeterminate low-attenuation lesions in the liver. Hepatic
metastases cannot definitely be excluded. Recommend abdomen MRI
without and with contrast for further characterization.

No other sites of metastatic disease identified.

Moderate hepatic steatosis.

## 2022-06-01 DIAGNOSIS — G4733 Obstructive sleep apnea (adult) (pediatric): Secondary | ICD-10-CM | POA: Diagnosis not present

## 2022-06-06 DIAGNOSIS — G4733 Obstructive sleep apnea (adult) (pediatric): Secondary | ICD-10-CM | POA: Diagnosis not present

## 2022-08-05 DIAGNOSIS — J069 Acute upper respiratory infection, unspecified: Secondary | ICD-10-CM | POA: Diagnosis not present

## 2022-08-08 ENCOUNTER — Other Ambulatory Visit: Payer: Self-pay | Admitting: Cardiology

## 2022-08-08 DIAGNOSIS — I10 Essential (primary) hypertension: Secondary | ICD-10-CM

## 2022-08-08 DIAGNOSIS — E782 Mixed hyperlipidemia: Secondary | ICD-10-CM

## 2022-09-07 ENCOUNTER — Other Ambulatory Visit: Payer: Self-pay

## 2022-09-07 DIAGNOSIS — I10 Essential (primary) hypertension: Secondary | ICD-10-CM

## 2022-09-07 DIAGNOSIS — E782 Mixed hyperlipidemia: Secondary | ICD-10-CM

## 2022-09-07 MED ORDER — LOSARTAN POTASSIUM-HCTZ 50-12.5 MG PO TABS: 1.0000 | ORAL_TABLET | Freq: Every day | ORAL | 0 refills | Status: DC

## 2022-09-07 MED ORDER — ROSUVASTATIN CALCIUM 20 MG PO TABS: 20.0000 mg | ORAL_TABLET | Freq: Every day | ORAL | 0 refills | Status: DC

## 2022-10-17 ENCOUNTER — Encounter: Payer: Self-pay | Admitting: Cardiology

## 2022-10-17 ENCOUNTER — Ambulatory Visit: Payer: 59 | Admitting: Cardiology

## 2022-10-17 VITALS — BP 110/70 | HR 63 | Resp 16 | Ht 68.0 in | Wt 238.0 lb

## 2022-10-17 DIAGNOSIS — E782 Mixed hyperlipidemia: Secondary | ICD-10-CM | POA: Diagnosis not present

## 2022-10-17 DIAGNOSIS — I10 Essential (primary) hypertension: Secondary | ICD-10-CM | POA: Diagnosis not present

## 2022-10-17 MED ORDER — ROSUVASTATIN CALCIUM 20 MG PO TABS: 20.0000 mg | ORAL_TABLET | Freq: Every day | ORAL | 3 refills | Status: DC

## 2022-10-17 MED ORDER — LOSARTAN POTASSIUM-HCTZ 50-12.5 MG PO TABS: 1.0000 | ORAL_TABLET | Freq: Every day | ORAL | 3 refills | Status: DC

## 2022-10-17 NOTE — Progress Notes (Addendum)
Patient referred by Merri Brunette, MD for abnormal EKG  Subjective:   Don Meyer, male    DOB: [birth year], 47 y.o.   MRN: [id number]   Chief Complaint  Patient presents with   Hypertension   Hyperlipidemia   Follow-up    6 month     HPI  47 y.o. Caucasian male with hypertension, hyperlipidemia  Patient is doing well, has no complaints today.  It is tentative end lab test through his employer, results elevated.  Reviewed results from 08/2022 with the patient.  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 He has had untreated hypertension and hyperlipidemia for quite some time. He has now decided to take care of his health issues.  He has establish care with a PCP and was recently started on losartan 25 mg and 10 mg daily.  Is started walking treadmill for 2 miles 4 days a week.  He admits to eating fast food in the past, but is trying to make positive changes to his diet as well.  He denies any chest pain shortness of breath, orthopnea, PND symptoms.     Current Outpatient Medications:    losartan-hydrochlorothiazide (HYZAAR) 50-12.5 MG tablet, Take 1 tablet by mouth daily., Disp: 90 tablet, Rfl: 0   rosuvastatin (CRESTOR) 20 MG tablet, Take 1 tablet (20 mg total) by mouth daily., Disp: 90 tablet, Rfl: 0  Cardiovascular and other pertinent studies:  EKG 10/17/2022: Sinus rhythm 63 bpm Normal EKG   Recent labs: 08/16/2021: Glucose 85, BUN/Cr 13/0.95. EGFR 101. Na/K 139/4.2.  Chol 117, TG 144, HDL 28, LDL 64  11/21/2020: Glucose 126, BUN/Cr 18/1.29. EGFR >60. Na/K 136/4.8. Rest of the CMP normal H/H 12/37. MCV 91. Platelets 251  04/06/2020: Glucose 90, BUN/Cr 11/0.91. EGFR normal. Na/K 144/3.8. Chol 137, TG 249, HDL 23, LDL 73  12/23/2019: Glucose 84, BUN/Cr 11/?. EGFR 97. Na/K 145/4.8. ALT: 84 H/H 15.7/46.6. MCV 88.9. Platelets 249 Chol 244, TG 183, HDL 26, LDL 181   Review of Systems   Cardiovascular:  Negative for chest pain, dyspnea on exertion, leg swelling, palpitations and syncope.         Vitals:   10/17/22 0832  BP: 110/70  Pulse: 63  Resp: 16  SpO2: 95%     Body mass index is 36.19 kg/m. Filed Weights   10/17/22 0832  Weight: 238 lb (108 kg)     Objective:   Physical Exam Vitals and nursing note reviewed.  Constitutional:      General: He is not in acute distress. Neck:     Vascular: No JVD.  Cardiovascular:     Rate and Rhythm: Normal rate and regular rhythm.     Heart sounds: Normal heart sounds. No murmur heard. Pulmonary:     Effort: Pulmonary effort is normal.     Breath sounds: Normal breath sounds. No wheezing or rales.  Musculoskeletal:     Right lower leg: No edema.     Left lower leg: No edema.         Assessment & Recommendations:   47 y.o. Caucasian male with hypertension, hyperlipidemia, h/o colon cancer  Hypertension: Controlled.  Refilled losartan-HCTZ 50-12.5 mg daily.  Mixed hyperlipidemia: Controlled as of 08/2022. Refilled Crestor 10 mg daily.    Will obtain labs from his employer, when available.  F/u in 1 year    Elder Negus, MD Pager: [phone] Office: [phone]  Addendum: 10/2022: Glucose 140 Chol 108, TG  173, HDL 27, LDL 56   Reviewed. Continue current medications.   Elder Negus, MD Pager: [phone] Office: [phone]

## 2022-10-24 ENCOUNTER — Encounter: Payer: Self-pay | Admitting: Cardiology

## 2022-10-25 NOTE — Telephone Encounter (Signed)
From patient.

## 2023-09-27 ENCOUNTER — Telehealth: Payer: Self-pay | Admitting: Cardiology

## 2023-09-27 DIAGNOSIS — I10 Essential (primary) hypertension: Secondary | ICD-10-CM

## 2023-09-27 DIAGNOSIS — E782 Mixed hyperlipidemia: Secondary | ICD-10-CM

## 2023-09-27 NOTE — Telephone Encounter (Signed)
 Please order BMP, lipid panel to be checked  before appt with me. Diagoses: Hypertension, mixed hyperlipidemia.  Thanks MJP

## 2023-09-27 NOTE — Telephone Encounter (Signed)
 Spoke with pt and he stated he would like to check his cholesterol prior to his appt with Dr. Elmira and any other labs that Dr. Elmira would recommend. Will send to Dr. Elmira to review.

## 2023-09-27 NOTE — Telephone Encounter (Signed)
 Pt requesting a order for blood work before upcoming appt. Please advise

## 2023-09-29 NOTE — Telephone Encounter (Signed)
 Spoke with pt over the phone and explained Dr. Elmira would like to order a BMP and lipid panel to be completed prior to 7/31 OV. Pt verbalized understanding of plan and had no further questions. Order for lipid panel and BMP has been placed and released.

## 2023-10-16 ENCOUNTER — Ambulatory Visit: Payer: Self-pay | Admitting: Cardiology

## 2023-10-16 DIAGNOSIS — I10 Essential (primary) hypertension: Secondary | ICD-10-CM | POA: Diagnosis not present

## 2023-10-16 DIAGNOSIS — E782 Mixed hyperlipidemia: Secondary | ICD-10-CM | POA: Diagnosis not present

## 2023-10-16 DIAGNOSIS — K5190 Ulcerative colitis, unspecified, without complications: Secondary | ICD-10-CM | POA: Diagnosis not present

## 2023-10-17 ENCOUNTER — Ambulatory Visit: Payer: Self-pay | Admitting: Cardiology

## 2023-10-17 LAB — BASIC METABOLIC PANEL WITH GFR
BUN/Creatinine Ratio: 12 (ref 9–20)
BUN: 11 mg/dL (ref 6–24)
CO2: 22 mmol/L (ref 20–29)
Calcium: 9.5 mg/dL (ref 8.7–10.2)
Chloride: 101 mmol/L (ref 96–106)
Creatinine, Ser: 0.91 mg/dL (ref 0.76–1.27)
Glucose: 122 mg/dL — ABNORMAL HIGH (ref 70–99)
Potassium: 4 mmol/L (ref 3.5–5.2)
Sodium: 139 mmol/L (ref 134–144)
eGFR: 105 mL/min/1.73 (ref >59)

## 2023-10-17 LAB — LIPID PANEL
Chol/HDL Ratio: 4.6 ratio (ref 0.0–5.0)
Cholesterol, Total: 111 mg/dL (ref 100–199)
HDL: 24 mg/dL — ABNORMAL LOW (ref >39)
LDL Chol Calc (NIH): 56 mg/dL (ref 0–99)
Triglycerides: 183 mg/dL — ABNORMAL HIGH (ref 0–149)
VLDL Cholesterol Cal: 31 mg/dL (ref 5–40)

## 2023-11-02 ENCOUNTER — Ambulatory Visit: Admitting: Cardiology

## 2023-11-09 DIAGNOSIS — K5120 Ulcerative (chronic) proctitis without complications: Secondary | ICD-10-CM | POA: Diagnosis not present

## 2023-11-09 DIAGNOSIS — Z1211 Encounter for screening for malignant neoplasm of colon: Secondary | ICD-10-CM | POA: Diagnosis not present

## 2023-11-09 DIAGNOSIS — K5190 Ulcerative colitis, unspecified, without complications: Secondary | ICD-10-CM | POA: Diagnosis not present

## 2023-11-09 DIAGNOSIS — K9189 Other postprocedural complications and disorders of digestive system: Secondary | ICD-10-CM | POA: Diagnosis not present

## 2023-11-11 ENCOUNTER — Other Ambulatory Visit: Payer: Self-pay | Admitting: Cardiology

## 2023-11-11 DIAGNOSIS — E782 Mixed hyperlipidemia: Secondary | ICD-10-CM

## 2023-11-11 DIAGNOSIS — I10 Essential (primary) hypertension: Secondary | ICD-10-CM

## 2023-11-23 ENCOUNTER — Encounter: Payer: Self-pay | Admitting: Cardiology

## 2023-11-23 ENCOUNTER — Ambulatory Visit: Attending: Cardiology | Admitting: Cardiology

## 2023-11-23 VITALS — BP 108/56 | HR 56 | Ht 68.0 in | Wt 230.0 lb

## 2023-11-23 DIAGNOSIS — I10 Essential (primary) hypertension: Secondary | ICD-10-CM | POA: Diagnosis not present

## 2023-11-23 DIAGNOSIS — E782 Mixed hyperlipidemia: Secondary | ICD-10-CM

## 2023-11-23 MED ORDER — ROSUVASTATIN CALCIUM 20 MG PO TABS: 20.0000 mg | ORAL_TABLET | Freq: Every day | ORAL | 3 refills | Status: AC

## 2023-11-23 MED ORDER — LOSARTAN POTASSIUM-HCTZ 50-12.5 MG PO TABS: 1.0000 | ORAL_TABLET | Freq: Every day | ORAL | 3 refills | Status: AC

## 2023-11-23 NOTE — Patient Instructions (Signed)
 Medication Instructions:  Refills sent today.   *If you need a refill on your cardiac medications before your next appointment, please call your pharmacy*  Lab Work: Fasting lipid panel in 12/2023  If you have labs (blood work) drawn today and your tests are completely normal, you will receive your results only by: MyChart Message (if you have MyChart) OR A paper copy in the mail If you have any lab test that is abnormal or we need to change your treatment, we will call you to review the results.  Follow-Up: At [facility], you and your health needs are our priority.  As part of our continuing mission to provide you with exceptional heart care, our providers are all part of one team.  This team includes your primary Cardiologist (physician) and Advanced Practice Providers or APPs (Physician Assistants and Nurse Practitioners) who all work together to provide you with the care you need, when you need it.  Your next appointment:   1 year(s)  Provider:   Newman JINNY Lawrence, MD

## 2023-11-23 NOTE — Progress Notes (Signed)
 Cardiology Office Note:  .   Date:  11/23/2023  ID:  Don Meyer, DOB 03-14-1976, MRN [id number] PCP: Clarice Nottingham, MD  [facility] Providers Cardiologist:  Newman Lawrence, MD PCP: Clarice Nottingham, MD  Chief Complaint  Patient presents with   Hyperlipidemia   Hypertension     Don Meyer is a 48 y.o. male with hypertension, hyperlipidemia   History of Present Illness  Patient is doing well.  He denies any complains of chest pain, shortness with.  He stays active and trying to eat healthy.  Blood pressure is well-controlled.  Reviewed recent lab results from July 2025.  In addition, patient also shared with me results of labs done through his employer in 11/2023 that showed triglycerides of 407.    Vitals:   11/23/23 0821  BP: (!) 108/56  Pulse: (!) 56  SpO2: 96%      Review of Systems  Cardiovascular:  Negative for chest pain, dyspnea on exertion, leg swelling, palpitations and syncope.        Studies Reviewed: SABRA        EKG 11/23/2023: Normal sinus rhythm Normal ECG No previous ECGs available    MRI abdomen 2022: 1. Subcentimeter liver lesions noted on prior CT correspond to benign subcentimeter fluid signal cysts of the liver without evidence of solid mass or suspicious contrast enhancement. 2. No evidence of metastatic disease or lymphadenopathy in the abdomen. 3. Hepatic steatosis.    CT chest 2022: Two tiny indeterminate low-attenuation lesions in the liver. Hepatic metastases cannot definitely be excluded. Recommend abdomen MRI without and with contrast for further characterization. No other sites of metastatic disease identified. Moderate hepatic steatosis.  Labs 11/2023: Triglyceride 407  10/2023: Chol 111, TG 183, HDL 24, LDL 56 Cr 0.91    Physical Exam Vitals and nursing note reviewed.  Constitutional:      General: He is not in acute distress. Neck:     Vascular: No JVD.  Cardiovascular:     Rate and Rhythm: Normal rate  and regular rhythm.     Heart sounds: Normal heart sounds. No murmur heard. Pulmonary:     Effort: Pulmonary effort is normal.     Breath sounds: Normal breath sounds. No wheezing or rales.  Musculoskeletal:     Right lower leg: No edema.     Left lower leg: No edema.      VISIT DIAGNOSES:   ICD-10-CM   1. Essential hypertension  I10 EKG 12-Lead    losartan -hydrochlorothiazide  (HYZAAR) 50-12.5 MG tablet    2. Mixed hyperlipidemia  E78.2 EKG 12-Lead    Lipid panel    rosuvastatin  (CRESTOR ) 20 MG tablet    Lipid panel       Don Meyer is a 48 y.o. male with hypertension, hyperlipidemia  Assessment & Plan  Hypertension: Controlled.  Refilled losartan -HCTZ 50-12.5 mg daily.   Mixed hyperlipidemia: LDL 56 in 10/2023, refill Crestor  20 mg daily. Triglyceride has varied from 100s to 400s, most recently 407 in 11/2023 on lab work performed through his employer. Discussed reducing intake of simple carbohydrates and high saturated fat diet. Repeat fasting lipid panel in 12/2023. If triglycerides remain elevated, will consider adding fenofibrate.   Meds ordered this encounter  Medications   losartan -hydrochlorothiazide  (HYZAAR) 50-12.5 MG tablet    Sig: Take 1 tablet by mouth daily.    Dispense:  90 tablet    Refill:  3   rosuvastatin  (CRESTOR ) 20 MG tablet    Sig: Take 1 tablet (20 mg  total) by mouth daily.    Dispense:  90 tablet    Refill:  3     F/u in 1 year At that time, if the patient blood pressure well-controlled, I will then see him as needed.  Signed, Newman JINNY Lawrence, MD
